# Patient Record
Sex: Male | Born: 1985 | Race: White | Hispanic: No | Marital: Single | Smoking: Current some day smoker
Health system: Southern US, Community
[De-identification: ages and names within clinical notes are randomized; demographics above are authoritative.]

## PROBLEM LIST (undated history)

## (undated) DIAGNOSIS — C021 Malignant neoplasm of border of tongue: Secondary | ICD-10-CM

## (undated) DIAGNOSIS — M5126 Other intervertebral disc displacement, lumbar region: Secondary | ICD-10-CM

## (undated) DIAGNOSIS — Z923 Personal history of irradiation: Secondary | ICD-10-CM

## (undated) DIAGNOSIS — A770 Spotted fever due to Rickettsia rickettsii: Secondary | ICD-10-CM

## (undated) HISTORY — PX: BACK SURGERY: SHX140

## (undated) HISTORY — PX: ADENOIDECTOMY: SUR15

## (undated) HISTORY — PX: SUPERFICIAL LYMPH NODE BIOPSY / EXCISION: SUR127

## (undated) HISTORY — DX: Malignant neoplasm of border of tongue: C02.1

---
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
## 2006-10-01 ENCOUNTER — Inpatient Hospital Stay (HOSPITAL_COMMUNITY): Admission: AC | Admit: 2006-10-01 | Discharge: 2006-10-02 | Payer: Self-pay

## 2006-10-11 ENCOUNTER — Emergency Department (HOSPITAL_COMMUNITY): Admission: EM | Admit: 2006-10-11 | Discharge: 2006-10-11 | Payer: Self-pay | Admitting: Emergency Medicine

## 2007-07-21 ENCOUNTER — Emergency Department (HOSPITAL_COMMUNITY): Admission: EM | Admit: 2007-07-21 | Discharge: 2007-07-21 | Payer: Self-pay | Admitting: Emergency Medicine

## 2007-11-25 ENCOUNTER — Emergency Department (HOSPITAL_COMMUNITY): Admission: EM | Admit: 2007-11-25 | Discharge: 2007-11-25 | Payer: Self-pay | Admitting: Emergency Medicine

## 2009-03-22 ENCOUNTER — Emergency Department (HOSPITAL_COMMUNITY): Admission: EM | Admit: 2009-03-22 | Discharge: 2009-03-22 | Payer: Self-pay | Admitting: Emergency Medicine

## 2011-02-14 LAB — CBC
HCT: 40.9 % (ref 39.0–52.0)
MCHC: 35.6 g/dL (ref 30.0–36.0)
RBC: 4.5 MIL/uL (ref 4.22–5.81)

## 2011-02-14 LAB — DIFFERENTIAL
Basophils Absolute: 0 10*3/uL (ref 0.0–0.1)
Eosinophils Absolute: 0 10*3/uL (ref 0.0–0.7)
Eosinophils Relative: 0 % (ref 0–5)
Monocytes Relative: 9 % (ref 3–12)
Neutro Abs: 7.2 10*3/uL (ref 1.7–7.7)

## 2011-02-14 LAB — COMPREHENSIVE METABOLIC PANEL
AST: 27 U/L (ref 0–37)
Albumin: 4.2 g/dL (ref 3.5–5.2)
CO2: 26 mEq/L (ref 19–32)
Calcium: 8.9 mg/dL (ref 8.4–10.5)
Chloride: 105 mEq/L (ref 96–112)
GFR calc Af Amer: >60 mL/min (ref >60)
GFR calc non Af Amer: >60 mL/min (ref >60)
Potassium: 3.5 mEq/L (ref 3.5–5.1)
Total Bilirubin: 0.4 mg/dL (ref 0.3–1.2)

## 2011-02-14 LAB — URINALYSIS, ROUTINE W REFLEX MICROSCOPIC
Protein, ur: NEGATIVE mg/dL
Urobilinogen, UA: 1 mg/dL (ref 0.0–1.0)

## 2011-03-24 NOTE — Op Note (Signed)
[place]. [facility]  Patient:    JAIMERE, FEUTZ                     MRN: [id number] Adm. Date:  [id number] Disc. Date: [id number] Attending:  Shelba Flake CC:         Marinus Maw, M.D.   Operative Report  CCS [phone].  PREOPERATIVE DIAGNOSIS:  Left groin adenopathy.  POSTOPERATIVE DIAGNOSIS:  Left groin adenopathy.  PROCEDURE:  Left inguinal node biopsy.  SURGEON:  Thornton Park. Daphine Deutscher, M.D.  ANESTHESIA:  LMA, general.  DESCRIPTION OF PROCEDURE:  Panagiotis Oelkers was taken to OR 3 and given general by LMA.  The left groin node was previously marked with his assistance, and with a little oblique incision was made, carried down through the fatty tissue.  I incised the anterior fascia and dissected around this node, which was hard, and I mobilized it.  I ligated its blood supply and then where it was matted to another adjoining node, I oversewed that with a running horizontal mattress suture of 4-0 Vicryl and then amputated it.  I sent this node, which was approximately 2 cm in greatest dimension, for touch preps and permanent sections.  No bleeding or lymphatic drainage was noted. It was then closed with 4-0 Vicryl subcutaneously and subcuticularly.  The patient seemed to tolerate the procedure well and was taken to the recovery room in satisfactory condition. DD:  09/25/00 TD:  09/25/00 Job: [id number]

## 2011-04-15 ENCOUNTER — Inpatient Hospital Stay (INDEPENDENT_AMBULATORY_CARE_PROVIDER_SITE_OTHER)
Admission: RE | Admit: 2011-04-15 | Discharge: 2011-04-15 | Disposition: A | Discharge status: Home / Self Care | Payer: Self-pay | Source: Ambulatory Visit | Attending: Emergency Medicine | Admitting: Emergency Medicine

## 2011-04-15 DIAGNOSIS — J029 Acute pharyngitis, unspecified: Secondary | ICD-10-CM

## 2011-04-15 DIAGNOSIS — J020 Streptococcal pharyngitis: Secondary | ICD-10-CM

## 2011-04-15 LAB — POCT RAPID STREP A: Streptococcus, Group A Screen (Direct): POSITIVE — AB

## 2012-05-30 ENCOUNTER — Emergency Department (HOSPITAL_COMMUNITY)
Admission: EM | Admit: 2012-05-30 | Discharge: 2012-05-30 | Disposition: A | Discharge status: Home / Self Care | Payer: Self-pay | Attending: Emergency Medicine | Admitting: Emergency Medicine

## 2012-05-30 ENCOUNTER — Encounter (HOSPITAL_COMMUNITY): Payer: Self-pay | Admitting: *Deleted

## 2012-05-30 DIAGNOSIS — T148 Other injury of unspecified body region: Secondary | ICD-10-CM | POA: Insufficient documentation

## 2012-05-30 DIAGNOSIS — B379 Candidiasis, unspecified: Secondary | ICD-10-CM | POA: Insufficient documentation

## 2012-05-30 DIAGNOSIS — D1739 Benign lipomatous neoplasm of skin and subcutaneous tissue of other sites: Secondary | ICD-10-CM | POA: Insufficient documentation

## 2012-05-30 DIAGNOSIS — W57XXXA Bitten or stung by nonvenomous insect and other nonvenomous arthropods, initial encounter: Secondary | ICD-10-CM | POA: Insufficient documentation

## 2012-05-30 DIAGNOSIS — F17200 Nicotine dependence, unspecified, uncomplicated: Secondary | ICD-10-CM | POA: Insufficient documentation

## 2012-05-30 DIAGNOSIS — D170 Benign lipomatous neoplasm of skin and subcutaneous tissue of head, face and neck: Secondary | ICD-10-CM

## 2012-05-30 HISTORY — DX: Spotted fever due to Rickettsia rickettsii: A77.0

## 2012-05-30 MED ORDER — KETOCONAZOLE 2 % EX SHAM: MEDICATED_SHAMPOO | CUTANEOUS | Status: AC

## 2012-05-30 NOTE — ED Notes (Signed)
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

## 2012-05-30 NOTE — ED Provider Notes (Signed)
History     CSN: [id number]  Arrival date & time 05/30/12  4098   First MD Initiated Contact with Patient 05/30/12 2125      Chief Complaint  Patient presents with  . Rash    (Consider location/radiation/quality/duration/timing/severity/associated sxs/prior treatment) HPI Comments: Larry Glover is a 26 y.o. Male who presents to the emergency department c/o rash and bumps.  He states the rash has been present for > 52yrs.  It itches severely and has spread from his groin onto his abdomen and thighs.  He has tried Benadryl, talcum powder, athlete's foot spray, hydrocortisone and bleach baths.  He states the hydrocortisone seems to make it worse, but nothing makes it better. He denies fever, chills, appetite changes, rash on other parts of the body, oozing of the rash.  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  He states a Hx of "severe jock itch" in his teens. He also c/o bumps found on her chest, arms, legs.  He states they are new since this morning and very itchy.  They have not been oozing either and he has not tried anything for them. He was outside last night in a friend's garden, but does not remember being bitten by anything.  Lastly he c/o a large bump on his head present for many years as well.  It is not tender, it is not erythematous, it is not itchy.  It has grown larger slowly.  He does not have headaches, pain in the area, or other constitutional symptoms.     The patient has medical history significant for:   Past Medical History:   [facility] spotted fever                                The onset of the symptoms was  gradual starting 2 year ago,  The Course is  persistent, gradually worsened Hydrocortisone, hot showers makes symptoms worse, nothing makes symptoms better Has associated itch, burning Denies fever,  oozing      Patient is a 26 y.o. male presenting with rash. The history is provided by the patient, medical records and the spouse. History Limited By: none.  Rash  This is a chronic problem. The current episode started more than 1 week ago. The problem has been gradually worsening.    Past Medical History  Diagnosis Date  . Rocky Mountain spotted fever     Past Surgical History  Procedure Date  . Back surgery     No family history on file.  History  Substance Use Topics  . Smoking status: Current Everyday Smoker -- 1.0 packs/day  . Smokeless tobacco: Not on file  . Alcohol Use: Yes     social      Review of Systems  Constitutional: Negative for fever, diaphoresis, appetite change, fatigue and unexpected weight change.  HENT: Negative for mouth sores.   Eyes: Negative for visual disturbance.  Respiratory: Negative for cough, chest tightness, shortness of breath and wheezing.   Cardiovascular: Negative for chest pain.  Gastrointestinal: Negative for nausea, vomiting, abdominal pain, diarrhea and constipation.  Genitourinary: Negative for dysuria, urgency, frequency and hematuria.  Musculoskeletal: Negative for back pain.  Skin: Positive for rash.       "bump" on top of head  Neurological: Negative for syncope, light-headedness and headaches.  Hematological: Does not bruise/bleed easily.  Psychiatric/Behavioral: Negative for disturbed wake/sleep cycle. The patient is not nervous/anxious.     Allergies  Review of patient's allergies indicates no known allergies.  Home Medications   Current Outpatient Rx  Name Route Sig Dispense Refill  . DIPHENHYDRAMINE HCL 25 MG PO CAPS Oral Take 25 mg by mouth once.      BP 110/84  Pulse 84  Temp 98.5 F (36.9 C) (Oral)  Resp 20  SpO2 98%  Physical Exam  Nursing note and vitals reviewed. Constitutional: He appears well-developed and well-nourished. No distress.  HENT:  Head: Normocephalic and atraumatic.        Lump on scalp in the temporal region, discrete, non-tender, mobile mass.  No erythema, necrosis, discoloration.    Eyes: Conjunctivae are normal. No scleral icterus.  Neck: Normal range of motion.  Cardiovascular: Normal rate, regular rhythm, normal heart sounds and intact distal pulses.   Pulmonary/Chest: Effort normal and breath sounds normal. No respiratory distress. He has no wheezes.  Musculoskeletal: Normal range of motion. He exhibits no edema.  Lymphadenopathy:    He has no cervical adenopathy.  Neurological: He is alert. No cranial nerve deficit. He exhibits normal muscle tone. Coordination normal.       Speech is clear and goal oriented Moves extremities without ataxia  Skin: Skin is warm and dry. Rash noted. He is not diaphoretic.       Large rash located in the groin area, inguinal folds and extending up onto the abdomen and down onto the inner thighs.  The rash is comprised of large patches of erythematous and scaling skin with some central clearing and discrete erythematous borders with large satellite lesions.  It is present around the scrotum and in the perianal area as well.  It is not on the penis.    There is one patch of 4 erythematous papules on the abdomen with accompanying excoriations.  There are similar sites on the R upper arm and both legs.    Psychiatric: He has a normal mood and affect. His behavior is normal. Judgment and thought content normal.    ED Course  Procedures (including critical care time)  Labs Reviewed - No data to display No results found.   1. Candida albicans infection   2. Insect bite   3. Lipoma of scalp      MDM  Larry Glover is a healthy male who presents to the ER for rash.  He is nontoxic and nonseptic appearing.  The rash looks like a fungal vs candida infection of the groin and is supported by the worsening with use of Hydrocortisone and chronic athletes foot.  His papules appear to be insect bites that have been scratched  open.  The lump on his head appears to be a lipoma and raises no red flags for melanoma or other skin cancer.  I will discharge home with an antifungal treatment of Nizoral shampoo and refer to dermatology.  I have recommended that he follow up with a PCP if he is unable to see dermatology.    1. Medications: Nizoral shampoo 2. Treatment: keep areas of rash and bites clean and dry.  OTC hydrocortisone or benadryl may be used on the bites but not on the rash.   3. Follow Up: with dermatology as soon as possible          Dierdre Forth, PA-C 05/30/12 2336

## 2012-05-30 NOTE — ED Notes (Signed)
Knot on top of head x 2 years

## 2012-05-31 NOTE — ED Provider Notes (Signed)
Medical screening examination/treatment/procedure(s) were performed by non-physician practitioner and as supervising physician I was immediately available for consultation/collaboration.  Seif Teichert, MD 05/31/12 2337 

## 2014-02-23 ENCOUNTER — Emergency Department (HOSPITAL_COMMUNITY)
Admission: EM | Admit: 2014-02-23 | Discharge: 2014-02-23 | Disposition: A | Discharge status: Home / Self Care | Payer: Self-pay | Attending: Emergency Medicine | Admitting: Emergency Medicine

## 2014-02-23 ENCOUNTER — Encounter (HOSPITAL_COMMUNITY): Payer: Self-pay | Admitting: Emergency Medicine

## 2014-02-23 DIAGNOSIS — Z8619 Personal history of other infectious and parasitic diseases: Secondary | ICD-10-CM | POA: Insufficient documentation

## 2014-02-23 DIAGNOSIS — K0889 Other specified disorders of teeth and supporting structures: Secondary | ICD-10-CM

## 2014-02-23 DIAGNOSIS — K089 Disorder of teeth and supporting structures, unspecified: Secondary | ICD-10-CM | POA: Insufficient documentation

## 2014-02-23 DIAGNOSIS — K029 Dental caries, unspecified: Secondary | ICD-10-CM | POA: Insufficient documentation

## 2014-02-23 DIAGNOSIS — F17200 Nicotine dependence, unspecified, uncomplicated: Secondary | ICD-10-CM | POA: Insufficient documentation

## 2014-02-23 MED ORDER — NAPROXEN 375 MG PO TABS: 375.0000 mg | ORAL_TABLET | Freq: Two times a day (BID) | ORAL | Status: DC

## 2014-02-23 MED ORDER — PENICILLIN V POTASSIUM 250 MG PO TABS: 500.0000 mg | ORAL_TABLET | Freq: Four times a day (QID) | ORAL | Status: AC

## 2014-02-23 MED ORDER — OXYCODONE-ACETAMINOPHEN 5-325 MG PO TABS: 1.0000 | ORAL_TABLET | Freq: Three times a day (TID) | ORAL | Status: DC | PRN

## 2014-02-23 NOTE — Discharge Instructions (Signed)
Please call your doctor for a followup appointment within 24-48 hours. When you talk to your doctor please let them know that you were seen in the emergency department and have them acquire all of your records so that they can discuss the findings with you and formulate a treatment plan to fully care for your new and ongoing problems. Please call and set-up an appointment with dentist - will need tooth to be extracted Please rest and stay hydrated Please take antibiotics as prescribed Please take pain medications as prescribed - while on pain medications there is to be no drinking alcohol, driving, operating any heavy machinery - if extra please dispose in a proper manner. Please do not take any extra tylenol for this can lead to tylenol overdose and liver failure.  Please continue to monitor symptoms closely and if symptoms are to worsen or change (fever greater than 101, chills, chest pain, shortness of breath, difficulty breathing, neck pain, difficulty swallowing, swelling to the face, hot to the face, drainage, pus drainage, bleeding, worsening pain headache, dizziness, visual changes) please report back to the ED immediately   Dental Pain A tooth ache may be caused by cavities (tooth decay). Cavities expose the nerve of the tooth to air and hot or cold temperatures. It may come from an infection or abscess (also called a boil or furuncle) around your tooth. It is also often caused by dental caries (tooth decay). This causes the pain you are having. DIAGNOSIS  Your caregiver can diagnose this problem by exam. TREATMENT   If caused by an infection, it may be treated with medications which kill germs (antibiotics) and pain medications as prescribed by your caregiver. Take medications as directed.  Only take over-the-counter or prescription medicines for pain, discomfort, or fever as directed by your caregiver.  Whether the tooth ache today is caused by infection or dental disease, you should see  your dentist as soon as possible for further care. SEEK MEDICAL CARE IF: The exam and treatment you received today has been provided on an emergency basis only. This is not a substitute for complete medical or dental care. If your problem worsens or new problems (symptoms) appear, and you are unable to meet with your dentist, call or return to this location. SEEK IMMEDIATE MEDICAL CARE IF:   You have a fever.  You develop redness and swelling of your face, jaw, or neck.  You are unable to open your mouth.  You have severe pain uncontrolled by pain medicine. MAKE SURE YOU:   Understand these instructions.  Will watch your condition.  Will get help right away if you are not doing well or get worse. Document Released: 10/23/2005 Document Revised: 01/15/2012 Document Reviewed: 06/10/2008 [facility] Patient Information 2014 [place].    Emergency Department Resource Guide 1) Find a Doctor and Pay Out of Pocket Although you won't have to find out who is covered by your insurance plan, it is a good idea to ask around and get recommendations. You will then need to call the office and see if the doctor you have chosen will accept you as a new patient and what types of options they offer for patients who are self-pay. Some doctors offer discounts or will set up payment plans for their patients who do not have insurance, but you will need to ask so you aren't surprised when you get to your appointment.  2) Contact Your Local Health Department Not all health departments have doctors that can see patients for  sick visits, but many do, so it is worth a call to see if yours does. If you don't know where your local health department is, you can check in your phone book. The CDC also has a tool to help you locate your state's health department, and many state websites also have listings of all of their local health departments.  3) Find a [facility] If your illness is not likely to be very  severe or complicated, you may want to try a walk in clinic. These are popping up all over the country in pharmacies, drugstores, and shopping centers. They're usually staffed by nurse practitioners or physician assistants that have been trained to treat common illnesses and complaints. They're usually fairly quick and inexpensive. However, if you have serious medical issues or chronic medical problems, these are probably not your best option.  No Primary Care Doctor: - Call Health Connect at  [phone] - they can help you locate a primary care doctor that  accepts your insurance, provides certain services, etc. - Physician Referral Service- [phone]  Chronic Pain Problems: Organization         Address  Phone   Notes  [facility]  [phone] Patients need to be referred by their primary care doctor.   Medication Assistance: Organization         Address  Phone   Notes  [facility] Medication [facility] [address] [place], [place] [address] [phone] --Must be a resident of [facility] -- Must have NO insurance coverage whatsoever (no Medicaid/ Medicare, etc.) -- The pt. MUST have a primary care doctor that directs their care regularly and follows them in the community   [facility]  [phone]   [employer]  [phone]    Agencies that provide inexpensive medical care: Organization         Address  Phone   Notes  [place]  [phone]   [facility]    [phone]   [facility] [place], [place] [address] [phone]   [place] [address], [place] [phone]   Planned Parenthood    [phone]   [facility]    [phone]   [place] and [place] [address], [place] Phone:  [phone], Fax:  [phone] Hours of Operation:  9 am - 6 pm, M-F.  Also accepts  Medicaid/Medicare and self-pay.  [facility] for [place], [address], [place] Phone: [phone], Fax: [phone]. Hours of Operation:  8:30 am - 5:30 pm, M-F.  Also accepts Medicaid and self-pay.  [facility] [place] [address] [place] Phone: [phone]   [place], [place], [place] [phone], Ext. 123 Mondays & Thursdays: 7-9 AM.  First 15 patients are seen on a first come, first serve basis.    [place] Providers:  Organization         Address  Phone   Notes  [facility] [address], [place] [phone] Also accepts self-pay patients.  [place], [place]  [phone]   [place], [address], [place] [phone]   [facility] [address] [place] [phone]  Lucianne Lei [address], [place]   [phone] Only accepts [place] patients after they have their name applied to their card.   Self-Pay (no insurance) in [facility]:  Organization         Address  Phone   Notes  Sickle Cell Patients, [facility] [place] [phone]   [facility] [place] [phone]   [facility] [place]  [place], [place], [place] [phone]   [facility]/Dr. Osei-Bonsu  [address], [place] or [address], [place] [phone] Phone number for both [place] and [place] locations is the same.  Urgent Medical and [facility] [address] [place] [phone]   [facility] [address], [place] or [address] [phone]   [facility] [address], [place] [phone], phone; [phone], fax Sees patients 1st and 3rd Saturday of every month.  Must not qualify for public or private insurance (i.e. Medicaid, Medicare, [place] Health Choice, Veterans' Benefits)  Household income should be no more than 200% of the poverty level The clinic cannot treat you if you are pregnant or think you are pregnant  Sexually transmitted diseases are not treated at the clinic.    Dental Care: Organization         Address  Phone  Notes  [facility] Department of [facility] [place] [phone] Accepts children up to age 11 who are enrolled in [place] or [place]; pregnant women with a Medicaid card; and children who have applied for Medicaid or [place] Health Choice, but were declined, whose parents can pay a reduced fee at time of service.  [facility] Department of [facility]  [address], [place] [phone] Accepts children up to age 29 who are enrolled in [place] or [place]; pregnant women with a Medicaid card; and children who have applied for Medicaid or [place] Health Choice, but were declined, whose parents can pay a reduced fee at time of service.  [facility] PRO[facility]  [place] [phone] Patients are seen by appointment only. Walk-ins are not accepted. [place] will see patients 20 years of age and older. Monday - Tuesday (8am-5pm) Most Wednesdays (8:30-5pm) $30 per visit, cash only  [facility] Adult Dental Access PROGRAM  [address], [facility] [phone] Patients are seen by appointment only. Walk-ins are not accepted. [place] will see patients 82 years of age and older. One Wednesday Evening (Monthly: Volunteer Based).  $30 per visit, cash only  [place]  [phone] for adults; Children under age 23, call Graduate Pediatric Dentistry at [phone]. Children aged  26-14, please call [phone] to request a pediatric application.  Dental services are provided in all areas of dental care including fillings, crowns and bridges, complete and partial dentures, implants, gum treatment, root canals, and extractions. Preventive care is also provided. Treatment is provided to both adults and children. Patients are selected via a lottery and there is often a waiting list.   [facility] [address], [place]  [phone] [email or web address], [place], [place] [phone], Ext.  123 Second and Fourth Thursday of each month, opens at 6:30 AM; Clinic ends at 9 AM.  Patients are seen on a first-come first-served basis, and a limited number are seen during each clinic.   [facility]  [address] Hillard Danker [place], [place] [phone]   Eligibility Requirements You must have lived in [place], [place], or [place] counties for at least the last three months.   You cannot be eligible for state or federal sponsored [employer], including [employer], [place], or [employer].   You generally cannot be eligible for healthcare insurance through your employer.    How to apply: Eligibility screenings are held every Tuesday and Wednesday afternoon from 1:00 pm until 4:00 pm. You do not need an appointment for the interview!  [facility] [address], [place], [place]   [place]  [place] Department  [place]  [phone]    Behavioral Health Resources in the Community: Intensive Outpatient Programs Organization         Address  Phone  Notes  [place]. [address] [place], [place] [phone]   [facility] Outpatient [address] [place] [address], [place], [place]   ADS: [facility] Drug [facility] [address],  [place], [place]   [place] [address]  [place], [place] or [phone]   Substance Abuse Resources Organization         Address  Phone  Notes  Alcohol and Drug Services  [phone]   [place]  [phone]   [facility] [place]   Chinita Pester  [phone]   Residential & Outpatient Substance Abuse Program  [phone]   Psychological Services Organization         Address  Phone  Notes  [facility] [place]  [place]  [phone]   [place] [address] [place] or [phone]    Mobile Crisis Teams Organization         Address  Phone  Notes  [facility], Mobile Crisis Care Unit  [phone]   [facility]  [address] [place], [place]   [place] [address] [place] [phone]    Self-Help/Support Groups Organization         Address  Phone             Notes  [place]. of [place] - variety of support groups  [place] Call for more information  Narcotics Anonymous (NA), [facility] [address], [employer] [place]  2 meetings at this location   Special educational needs teacher         Address  Phone  Notes  [facility] [place],    [place]  [phone]   [facility]  [address] [place] [address]24235, [place], [place]   [place], [place] [phone] Admissions: 8am-3pm M-F  Incentives Substance [place] [address]    [place], [place] [phone]   [facility] [address] Jadene Pierini [place], [place]   [facility] [place].,  [place], [place] - Intensive Outpatient [address], [place], [place]   [facility]  ([place]  [facility]oc[facility] [address]  °[place], [place] [phone] or [phone]   °[facility] ([facility]) [address] [place], [place] [phone] Accepts Medicaid  °[facility] [address]  °[place] 1-800-659-3381 Substance Abuse/Addiction Treatment  ° °[place] Behavioral Health Resources °Organization         Address  Phone  Notes  °[facility]  ([phone]) [phone]   °Julie Brannon, PhD [address] [place], [place]   ([phone]) [phone] or (336) [phone]   °[facility]   [address] °[place], [place] (336) 349-4454   °[facility] [address], [place], [place] [phone]) [phone] Insurance/Medicaid/sponsorship through [facility]  °[facility] [address]                                    [place], [place] [phone]) [phone] Therapy/tele-psych/case  °[facility] [address]  ° [place], [place] ([phone]    °Dr. Arfeen  ([phone]) [phone]   °[facility] [place]  [employer] [place] [facility] 1) [address], [place] °2) [address], [place] °3)  [address], [place] ([phone]) [phone] °(336) [phone] ° °(336) [phone]   °[place] Child Abuse Hotline (336) 342-1394 or (336) [phone] (After Hours)    ° ° ° °

## 2014-02-23 NOTE — ED Provider Notes (Signed)
CSN: [id number]     Arrival date & time 02/23/14  1500 History  This chart was scribed for non-physician practitioner, Jamse Mead, PA-C,working with Saddie Benders. Dorna Mai, MD, by Marlowe Kays, ED Scribe.  This patient was seen in room WTR7/WTR7 and the patient's care was started at 5:45 PM.  Chief Complaint  Patient presents with  . Dental Pain   The history is provided by the patient. No language interpreter was used.   HPI Comments:  Larry Glover is a 28 y.o. male who presents to the Emergency Department complaining of severe upper right dental pain secondary to having two broken teeth. He states the pain started approximately two to three days ago and is a stabbing pain. He states he is experiencing some mild facial swelling as well. Pt states hot or cold makes the pain worse and he has been avoiding chewing on the right side. Pt reports taking Ibuprofen with no relief. He denies fever, chills, drooling, CP, SOB, drainage or bleeding from the site, no blurry vision or loss of vision. He denies any allergies to any medications.  Past Medical History  Diagnosis Date  . Rocky [place] spotted fever    Past Surgical History  Procedure Laterality Date  . Back surgery     History reviewed. No pertinent family history. History  Substance Use Topics  . Smoking status: Current Every Day Smoker -- 1.00 packs/day  . Smokeless tobacco: Not on file  . Alcohol Use: Yes     Comment: social    Review of Systems  Constitutional: Negative for fever and chills.  HENT: Positive for dental problem and facial swelling. Negative for drooling.   Eyes: Negative for visual disturbance.  Respiratory: Negative for shortness of breath.   Cardiovascular: Negative for chest pain.  All other systems reviewed and are negative.   Allergies  Review of patient's allergies indicates no known allergies.  Home Medications   Prior to Admission medications   Medication Sig Start Date End Date Taking?  Authorizing Provider  diphenhydrAMINE (BENADRYL) 25 mg capsule Take 25 mg by mouth once.    Historical Provider, MD   Triage Vitals: BP 111/57  Pulse 61  Temp(Src) 98.3 F (36.8 C) (Oral)  Resp 18  Ht 5\' 11"  (1.803 m)  Wt 195 lb (88.451 kg)  BMI 27.21 kg/m2  SpO2 98% Physical Exam  Nursing note and vitals reviewed. Constitutional: He is oriented to person, place, and time. He appears well-developed and well-nourished.  HENT:  Head: Normocephalic and atraumatic.  Mouth/Throat: Oropharynx is clear and moist and mucous membranes are normal. He does not have dentures. No trismus in the jaw. Dental caries present. No dental abscesses, uvula swelling or lacerations. No oropharyngeal exudate.    Negative facial swelling noted. First molar of right maxillary jawline identified to be diagrammed in decaying. Negative abscess formation noted. Negative inflammation, swelling, erythema, bleeding or drainage noted to the gumline. Uvula midline with symmetrical elevation. Negative deviation of the uvula. Negative uvula swelling. Negative trismus. Negative sublingual lesions. Negative trismus.  Eyes: Conjunctivae and EOM are normal. Pupils are equal, round, and reactive to light. Right eye exhibits no discharge. Left eye exhibits no discharge.  Neck: Normal range of motion. Neck supple. No tracheal deviation present.  Negative neck stiffness Negative nuchal rigidity Negative cervical LAD Negative meningeal signs   Cardiovascular: Normal rate, regular rhythm and normal heart sounds.  Exam reveals no friction rub.   No murmur heard. Pulses:      Radial  pulses are 2+ on the right side, and 2+ on the left side.  Pulmonary/Chest: Effort normal and breath sounds normal. No respiratory distress. He has no wheezes. He has no rales.  Musculoskeletal: Normal range of motion.  Lymphadenopathy:    He has no cervical adenopathy.  Neurological: He is alert and oriented to person, place, and time. No cranial  nerve deficit. He exhibits normal muscle tone. Coordination normal.  Skin: Skin is warm and dry.  Psychiatric: He has a normal mood and affect. His behavior is normal.    ED Course  Procedures (including critical care time) DIAGNOSTIC STUDIES: Oxygen Saturation is 98% on RA, normal by my interpretation.   COORDINATION OF CARE: 5:50 PM- Will give pt resource guide for PCP and dental referral. Will prescribe PCN and pain medication. Pt verbalizes understanding and agrees to plan.  Medications - No data to display  Labs Review Labs Reviewed - No data to display  Imaging Review No results found.   EKG Interpretation None      MDM   Final diagnoses:  Pain, dental    Filed Vitals:   02/23/14 1535 02/23/14 1817  BP: 111/57 120/71  Pulse: 61 79  Temp: 98.3 F (36.8 C)   TempSrc: Oral   Resp: 18 14  Height: 5\' 11"  (1.803 m)   Weight: 195 lb (88.451 kg)   SpO2: 98% 99%    I personally performed the services described in this documentation, which was scribed in my presence. The recorded information has been reviewed and is accurate.  Patient presenting to the ED with dental pain does been ongoing for the past 2-3 days localize the right maxillary jawline described as a stabbing pain worse with chewing. Reported that he's been using over-the-counter medications with minimal relief. Stated that he hasn't seen a dentist in a while. Alert and oriented. GCS 15. Heart rate and rhythm normal. Lungs clear to auscultation. Negative stridor, negative respiratory distress. Negative use of excess her muscles. Patient is able to speak in full senses without difficulty. Poor dentition identified-right first molar of the maxillary jawline identified to be diagrammed in decaying process. Negative trismus. Negative findings of erythema to the gumline, negative active drainage or bleeding. Negative findings of periapical abscess. Negative findings of erythema, inflammation, lesions, sores noted to  the gumline. Negative uvula swelling, negative deviation. Negative sublingual lesions. Doubt Ludwig's angina. Doubt peritonsillar or retropharyngeal abscess. Poor dentition leading to dental pain. Patient stable, afebrile. Discharged patient. Referred patient to dentist. Dental referral sheet given. Discussed with patient to rest and stay hydrated. Discharge patient with small dose of pain medications and antibiotics - discussed course, cautions, disposal technique. Discussed with patient to closely monitor symptoms and if symptoms are to worsen or change to report back to the ED - strict return instructions given.  Patient agreed to plan of care, understood, all questions answered.   Jamse Mead, PA-C 02/23/14 2216

## 2014-02-23 NOTE — ED Notes (Signed)
Pt moved to TR 7 at this time.

## 2014-02-23 NOTE — ED Notes (Signed)
Initial contact - pt reports dental pain x2 days, denies fevers/chills or other complaints.  Skin PWD.  Speaking full/clear sentences.  NAD.

## 2014-02-23 NOTE — ED Notes (Signed)
Pt having dental pain for 2 days.

## 2014-02-23 NOTE — ED Notes (Signed)
Pt not answering initial call to come to treatment area, will attempt again.

## 2014-02-26 NOTE — ED Provider Notes (Signed)
Medical screening examination/treatment/procedure(s) were performed by non-physician practitioner and as supervising physician I was immediately available for consultation/collaboration.   EKG Interpretation None        Seneca Hoback. Dorna Mai, MD 02/26/14 [phone]

## 2014-05-12 ENCOUNTER — Emergency Department (HOSPITAL_COMMUNITY)
Admission: EM | Admit: 2014-05-12 | Discharge: 2014-05-12 | Disposition: A | Discharge status: Home / Self Care | Payer: Self-pay | Attending: Emergency Medicine | Admitting: Emergency Medicine

## 2014-05-12 ENCOUNTER — Emergency Department (HOSPITAL_COMMUNITY): Payer: Self-pay

## 2014-05-12 ENCOUNTER — Encounter (HOSPITAL_COMMUNITY): Payer: Self-pay | Admitting: Emergency Medicine

## 2014-05-12 DIAGNOSIS — S0510XA Contusion of eyeball and orbital tissues, unspecified eye, initial encounter: Secondary | ICD-10-CM | POA: Insufficient documentation

## 2014-05-12 DIAGNOSIS — H18822 Corneal disorder due to contact lens, left eye: Secondary | ICD-10-CM

## 2014-05-12 DIAGNOSIS — Z8619 Personal history of other infectious and parasitic diseases: Secondary | ICD-10-CM | POA: Insufficient documentation

## 2014-05-12 DIAGNOSIS — Y9389 Activity, other specified: Secondary | ICD-10-CM | POA: Insufficient documentation

## 2014-05-12 DIAGNOSIS — Z791 Long term (current) use of non-steroidal anti-inflammatories (NSAID): Secondary | ICD-10-CM | POA: Insufficient documentation

## 2014-05-12 DIAGNOSIS — F17200 Nicotine dependence, unspecified, uncomplicated: Secondary | ICD-10-CM | POA: Insufficient documentation

## 2014-05-12 DIAGNOSIS — H18829 Corneal disorder due to contact lens, unspecified eye: Secondary | ICD-10-CM | POA: Insufficient documentation

## 2014-05-12 DIAGNOSIS — X58XXXA Exposure to other specified factors, initial encounter: Secondary | ICD-10-CM | POA: Insufficient documentation

## 2014-05-12 DIAGNOSIS — H18892 Other specified disorders of cornea, left eye: Secondary | ICD-10-CM

## 2014-05-12 DIAGNOSIS — Y9269 Other specified industrial and construction area as the place of occurrence of the external cause: Secondary | ICD-10-CM | POA: Insufficient documentation

## 2014-05-12 MED ORDER — TETRACAINE HCL 0.5 % OP SOLN
1.0000 [drp] | Freq: Once | OPHTHALMIC | Status: AC
  Administered 2014-05-12: 1 [drp] via OPHTHALMIC
  Filled 2014-05-12: qty 2

## 2014-05-12 MED ORDER — POLYMYXIN B-TRIMETHOPRIM 10000-0.1 UNIT/ML-% OP SOLN: 1.0000 [drp] | OPHTHALMIC | Status: DC

## 2014-05-12 MED ORDER — FLUORESCEIN SODIUM 1 MG OP STRP
1.0000 | ORAL_STRIP | Freq: Once | OPHTHALMIC | Status: AC
  Administered 2014-05-12: 1 via OPHTHALMIC
  Filled 2014-05-12: qty 1

## 2014-05-12 NOTE — ED Provider Notes (Signed)
CSN: [id number]     Arrival date & time 05/12/14  1614 History  This chart was scribed for non-physician practitioner, Monico Blitz, PA-C working with Ezequiel Essex, MD by Frederich Balding, ED scribe. This patient was seen in room TR04C/TR04C and the patient's care was started at 5:42 PM.   Chief Complaint  Patient presents with  . Eye Pain   The history is provided by the patient. No language interpreter was used.   HPI Comments: Larry Glover is a 28 y.o. male who presents to the Emergency Department complaining of sudden onset left eye pain with associated redness, blurred vision and photophobia that started yesterday after drilling metal at work. Reports gradual onset swelling around his eye. Pt states that symptoms worsened today. States he tried some of his son's antibiotic eye drops with no relief. Pt's last tetanus was in 2010.  Past Medical History  Diagnosis Date  . Rocky Mountain spotted fever    Past Surgical History  Procedure Laterality Date  . Back surgery     History reviewed. No pertinent family history. History  Substance Use Topics  . Smoking status: Current Every Day Smoker -- 1.00 packs/day    Types: Cigarettes  . Smokeless tobacco: Not on file  . Alcohol Use: Yes     Comment: social    Review of Systems  HENT: Positive for facial swelling.   Eyes: Positive for photophobia, pain, redness and visual disturbance.  All other systems reviewed and are negative.  Allergies  Review of patient's allergies indicates no known allergies.  Home Medications   Prior to Admission medications   Medication Sig Start Date End Date Taking? Authorizing Provider  ibuprofen (ADVIL,MOTRIN) 200 MG tablet Take 400 mg by mouth every 6 (six) hours as needed for moderate pain.    Historical Provider, MD  naproxen (NAPROSYN) 375 MG tablet Take 1 tablet (375 mg total) by mouth 2 (two) times daily. 02/23/14   Marissa Sciacca, PA-C  oxyCODONE-acetaminophen (PERCOCET/ROXICET)  5-325 MG per tablet Take 1 tablet by mouth every 8 (eight) hours as needed for severe pain. 02/23/14   Marissa Sciacca, PA-C   BP 104/83  Pulse 70  Temp(Src) 98.8 F (37.1 C) (Oral)  Resp 18  SpO2 100%  Physical Exam  Nursing note and vitals reviewed. Constitutional: He is oriented to person, place, and time. He appears well-developed and well-nourished. No distress.  HENT:  Head: Normocephalic.  Eyes: EOM and lids are normal. Pupils are equal, round, and reactive to light. Lids are everted and swept, no foreign bodies found. Left eye exhibits no chemosis, no discharge, no exudate and no hordeolum. Left conjunctiva is injected.  Slit lamp exam:      The left eye shows no corneal abrasion, no corneal flare, no corneal ulcer and no fluorescein uptake.    Visual Acuity: Right Eye 20/20   Left Eye 20/50   Bilateral Eyes 20/20 Mild conjunctival injection on left side. Rust ring at 5 o'clock. Anterior chamber with no cells or flare. Positive photophobia. No consensual photophobia.   Cardiovascular: Normal rate.   Pulmonary/Chest: Effort normal. No stridor.  Musculoskeletal: Normal range of motion.  Neurological: He is alert and oriented to person, place, and time.  Psychiatric: He has a normal mood and affect.    ED Course  Procedures (including critical care time)  DIAGNOSTIC STUDIES: Oxygen Saturation is 100% on RA, normal by my interpretation.    COORDINATION OF CARE: 5:45 PM-Discussed treatment plan which includes checking for corneal  abrasion with pt at bedside and pt agreed to plan.   Labs Review Labs Reviewed - No data to display  Imaging Review Ct Orbitss W/o Cm  05/12/2014   CLINICAL DATA:  Eye pain and swelling.  EXAM: CT ORBITS WITHOUT CONTRAST  TECHNIQUE: Multidetector CT imaging of the orbits was performed following the standard protocol without intravenous contrast.  COMPARISON:  None.  FINDINGS: The visualized portion of the brain is unremarkable. The globes are  intact. Mild soft tissue thickening involving the upper lid region of the left eye. No intraorbital abnormality. No radiopaque foreign body. No periorbital cellulitis.  There is a mucous retention cyst or polyp in the right maxillary sinus. Otherwise, the paranasal sinuses and mastoid air cells are clear.  IMPRESSION: Mild soft tissue swelling involving the upper lip region of the left eye but no abscess, foreign body or periorbital cellulitis.   Electronically Signed   By: Kalman Jewels M.D.   On: 05/12/2014 18:51     EKG Interpretation None      MDM   Final diagnoses:  None    Filed Vitals:   05/12/14 1621 05/12/14 1922  BP: 104/83 121/67  Pulse: 70 70  Temp: 98.8 F (37.1 C)   TempSrc: Oral   Resp: 18 16  SpO2: 100% 99%    Medications  fluorescein ophthalmic strip 1 strip (1 strip Both Eyes Given 05/12/14 1804)  tetracaine (PONTOCAINE) 0.5 % ophthalmic solution 1 drop (1 drop Both Eyes Given 05/12/14 1759)    Larry Glover is a 28 y.o. male presenting with left eye pain after drilling a metal screw yesterday. Small rust ring with pinpoint central corneal abrasion. Patient is photophobic but there is no consensual photophobia. Anterior chamber is clear, pupils are equal round and reactive to light. Extraocular movement is intact. CT shows no foreign body. Patient will be given prescription for Polytrim and asked to follow closely with ophthalmology. Discussed case with attending MD who agrees with plan and stability to d/c to home.   Evaluation does not show pathology that would require ongoing emergent intervention or inpatient treatment. Pt is hemodynamically stable and mentating appropriately. Discussed findings and plan with patient/guardian, who agrees with care plan. All questions answered. Return precautions discussed and outpatient follow up given.   Discharge Medication List as of 05/12/2014  7:18 PM    START taking these medications   Details  !!  trimethoprim-polymyxin b (POLYTRIM) ophthalmic solution Place 1 drop into the left eye every 4 (four) hours., Starting 05/12/2014, Until Discontinued, Print     !! - Potential duplicate medications found. Please discuss with provider.         I personally performed the services described in this documentation, which was scribed in my presence. The recorded information has been reviewed and is accurate.  Monico Blitz, PA-C 05/12/14 2007

## 2014-05-12 NOTE — ED Notes (Signed)
Pt presents to department for evaluation of L eye pain. States he was drilling metal yesterday at work, after L eye became swollen and painful. Now states increased pain and discomfort today. 9/10 pain upon arrival. Pt is alert and oriented x4.

## 2014-05-12 NOTE — ED Notes (Addendum)
Right Eye 20/20   Left Eye 20/50   Both Eyes 20/20   Patient does not wear glasses or contacts.

## 2014-05-12 NOTE — Discharge Instructions (Signed)
Put one drop of the Polytrim in the left eye every 4 hours. Do not hesitate to return to the emergency room for any new, worsening or concerning symptoms.  Please follow with the ophthalmologist next 24-48 hours.  Do not hesitate to return to the Emergency Department for any new, worsening or concerning symptoms.   If you do not have a primary care doctor you can establish one at the   [facility]: [place] [id number] [phone]  After you establish care. Let them know you were seen in the emergency room. They must obtain records for further management.

## 2014-05-13 NOTE — ED Provider Notes (Signed)
Medical screening examination/treatment/procedure(s) were performed by non-physician practitioner and as supervising physician I was immediately available for consultation/collaboration.   EKG Interpretation None        Ezequiel Essex, MD 05/13/14 0025

## 2016-03-30 ENCOUNTER — Encounter (HOSPITAL_COMMUNITY): Payer: Self-pay | Admitting: Emergency Medicine

## 2016-03-30 ENCOUNTER — Emergency Department (HOSPITAL_COMMUNITY)
Admission: EM | Admit: 2016-03-30 | Discharge: 2016-03-30 | Disposition: A | Discharge status: Home / Self Care | Payer: Self-pay | Attending: Emergency Medicine | Admitting: Emergency Medicine

## 2016-03-30 ENCOUNTER — Emergency Department (HOSPITAL_COMMUNITY): Payer: Self-pay

## 2016-03-30 DIAGNOSIS — Z79899 Other long term (current) drug therapy: Secondary | ICD-10-CM | POA: Insufficient documentation

## 2016-03-30 DIAGNOSIS — R0789 Other chest pain: Secondary | ICD-10-CM | POA: Insufficient documentation

## 2016-03-30 DIAGNOSIS — F17210 Nicotine dependence, cigarettes, uncomplicated: Secondary | ICD-10-CM | POA: Insufficient documentation

## 2016-03-30 LAB — BASIC METABOLIC PANEL
ANION GAP: 6 (ref 5–15)
BUN: 16 mg/dL (ref 6–20)
CALCIUM: 9 mg/dL (ref 8.9–10.3)
CO2: 25 mmol/L (ref 22–32)
Chloride: 104 mmol/L (ref 101–111)
Creatinine, Ser: 1.43 mg/dL — ABNORMAL HIGH (ref 0.61–1.24)
Glucose, Bld: 106 mg/dL — ABNORMAL HIGH (ref 65–99)
Potassium: 4 mmol/L (ref 3.5–5.1)
SODIUM: 135 mmol/L (ref 135–145)

## 2016-03-30 LAB — I-STAT TROPONIN, ED: TROPONIN I, POC: 0 ng/mL (ref 0.00–0.08)

## 2016-03-30 LAB — CBC
HCT: 47.3 % (ref 39.0–52.0)
HEMOGLOBIN: 16.5 g/dL (ref 13.0–17.0)
MCH: 31.7 pg (ref 26.0–34.0)
MCHC: 34.9 g/dL (ref 30.0–36.0)
MCV: 90.8 fL (ref 78.0–100.0)
Platelets: 210 10*3/uL (ref 150–400)
RBC: 5.21 MIL/uL (ref 4.22–5.81)
RDW: 12.8 % (ref 11.5–15.5)
WBC: 7.9 10*3/uL (ref 4.0–10.5)

## 2016-03-30 MED ORDER — DIAZEPAM 5 MG PO TABS: 5.0000 mg | ORAL_TABLET | Freq: Two times a day (BID) | ORAL | Status: DC

## 2016-03-30 MED ORDER — IBUPROFEN 800 MG PO TABS: 800.0000 mg | ORAL_TABLET | Freq: Three times a day (TID) | ORAL | Status: DC

## 2016-03-30 MED ORDER — DIAZEPAM 5 MG PO TABS
5.0000 mg | ORAL_TABLET | Freq: Once | ORAL | Status: AC
  Administered 2016-03-30: 5 mg via ORAL
  Filled 2016-03-30: qty 1

## 2016-03-30 MED ORDER — KETOROLAC TROMETHAMINE 30 MG/ML IJ SOLN
30.0000 mg | Freq: Once | INTRAMUSCULAR | Status: AC
  Administered 2016-03-30: 30 mg via INTRAMUSCULAR
  Filled 2016-03-30: qty 1

## 2016-03-30 NOTE — ED Provider Notes (Signed)
CSN: [id number]     Arrival date & time 03/30/16  0913 History   First MD Initiated Contact with Patient 03/30/16 1007     Chief Complaint  Patient presents with  . Chest Pain    HPI   Larry Glover is an 30 y.o. male with history of RMSF who presents to the ED for evaluation of left sided chest pain. He reports one particular area inferior to his left breast that has been painful for over a week and progressively getting worse. He states the pain is worse with movement or when laying down. He states he works in [employer] but does not recall a specific injury or inciting event. States the pain is worse with deep breaths so he sometimes feels SOB due to the pain. Denies feeling faint or lightheaded. Denies diaphoressis. Denies radiation of the pain. He has not tried anything to alleviate the pain. Endorses smoking 1 PPD. Denies significant fam hx of cardiac events. Denies cough, congestion, fever, chills.    Past Medical History  Diagnosis Date  . Rocky Mountain spotted fever    Past Surgical History  Procedure Laterality Date  . Back surgery     No family history on file. Social History  Substance Use Topics  . Smoking status: Current Every Day Smoker -- 1.00 packs/day    Types: Cigarettes  . Smokeless tobacco: None  . Alcohol Use: Yes     Comment: social    Review of Systems  All other systems reviewed and are negative.     Allergies  Review of patient's allergies indicates no known allergies.  Home Medications   Prior to Admission medications   Medication Sig Start Date End Date Taking? Authorizing Provider  diazepam (VALIUM) 5 MG tablet Take 1 tablet (5 mg total) by mouth 2 (two) times daily. 03/30/16   Olivia Canter Razia Screws, PA-C  ibuprofen (ADVIL,MOTRIN) 800 MG tablet Take 1 tablet (800 mg total) by mouth 3 (three) times daily. 03/30/16   Olivia Canter Maelynn Moroney, PA-C  trimethoprim-polymyxin b (POLYTRIM) ophthalmic solution Place 1 drop into the left eye every 4 (four)  hours. Patient not taking: Reported on 03/30/2016 05/12/14   Elmyra Ricks Pisciotta, PA-C   BP 121/79 mmHg  Pulse 73  Temp(Src) 97.8 F (36.6 C) (Oral)  Resp 18  SpO2 100% Physical Exam  Constitutional: He is oriented to person, place, and time.  HENT:  Right Ear: External ear normal.  Left Ear: External ear normal.  Nose: Nose normal.  Mouth/Throat: Oropharynx is clear and moist. No oropharyngeal exudate.  Eyes: Conjunctivae and EOM are normal. Pupils are equal, round, and reactive to light.  Neck: Normal range of motion. Neck supple.  Cardiovascular: Normal rate, regular rhythm, normal heart sounds and intact distal pulses.   Pulmonary/Chest: Effort normal and breath sounds normal. No respiratory distress. He has no wheezes. He has no rales. He exhibits tenderness.    Abdominal: Soft. Bowel sounds are normal. He exhibits no distension. There is no tenderness. There is no rebound and no guarding.  Musculoskeletal: He exhibits no edema.  Neurological: He is alert and oriented to person, place, and time. No cranial nerve deficit.  Skin: Skin is warm and dry.  Psychiatric: He has a normal mood and affect.  Nursing note and vitals reviewed.   ED Course  Procedures (including critical care time) Labs Review Labs Reviewed  BASIC METABOLIC PANEL - Abnormal; Notable for the following:    Glucose, Bld 106 (*)    Creatinine, Ser  1.43 (*)    All other components within normal limits  CBC  I-STAT TROPOININ, ED    Imaging Review Dg Chest 2 View  03/30/2016  CLINICAL DATA:  Shortness of breath, left anterior chest pain for 1 week EXAM: CHEST  2 VIEW COMPARISON:  09/04/2004 FINDINGS: Cardiomediastinal silhouette is stable. No acute infiltrate or pulmonary edema. 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 Old avulsed bony fragment just inferior to distal left clavicle. IMPRESSION: No acute infiltrate or pulmonary edema. 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 Old avulsed bony fragment just inferior to distal left clavicle. Electronically Signed   By: Lahoma Crocker M.D.   On: 03/30/2016 10:00   I have personally reviewed and evaluated these images and lab results as part of my medical decision-making.   EKG Interpretation   Date/Time:  Thursday Mar 30 2016 09:24:09 EDT Ventricular Rate:  70 PR Interval:  138 QRS Duration: 84 QT Interval:  383 QTC Calculation: 413 R Axis:   81 Text Interpretation:  Sinus rhythm ST elev, probable normal early repol  pattern Baseline wander in lead(s) V3 agree. no old comparison. Confirmed  by Johnney Killian, MD, Jeannie Done [phone]) on 03/30/2016 10:18:09 AM      MDM   Final diagnoses:  Atypical chest pain    Suspect msk pain given history, clinical course, and tenderness on exam. EKG nonacute. HEART score 2. Doubt ACS. Will trial NSAIDs and muscle relaxants. Resource guide given for PCP f/u. ER return precautions given.     Anne Ng, PA-C 03/30/16 1135  Charlesetta Shanks, MD 03/30/16 [phone]

## 2016-03-30 NOTE — ED Notes (Signed)
Discharge instructions, follow up care, and rx x2 reviewed with patient. Patient verbalized understanding. 

## 2016-03-30 NOTE — Discharge Instructions (Signed)
Take medications as prescribed. Return to the emergency room for worsening condition or new concerning symptoms. Follow up with your regular doctor. If you don't have a regular doctor use one of the numbers below to establish a primary care doctor. ° ° °Emergency Department Resource Guide °1) Find a Doctor and Pay Out of Pocket °Although you won't have to find out who is covered by your insurance plan, it is a good idea to ask around and get recommendations. You will then need to call the office and see if the doctor you have chosen will accept you as a new patient and what types of options they offer for patients who are self-pay. Some doctors offer discounts or will set up payment plans for their patients who do not have insurance, but you will need to ask so you aren't surprised when you get to your appointment. ° °2) Contact Your Local Health Department °Not all health departments have doctors that can see patients for sick visits, but many do, so it is worth a call to see if yours does. If you don't know where your local health department is, you can check in your phone book. The CDC also has a tool to help you locate your state's health department, and many state websites also have listings of all of their local health departments. ° °3) Find a Walk-in Clinic °If your illness is not likely to be very severe or complicated, you may want to try a walk in clinic. These are popping up all over the country in pharmacies, drugstores, and shopping centers. They're usually staffed by nurse practitioners or physician assistants that have been trained to treat common illnesses and complaints. They're usually fairly quick and inexpensive. However, if you have serious medical issues or chronic medical problems, these are probably not your best option. ° °No Primary Care Doctor: °- Call [facility] at  832-8000 - they can help you locate a primary care doctor that  accepts your insurance, provides certain services,  etc. °- Physician Referral Service- 1-800-533-3463 ° °Emergency Department Resource Guide °1) Find a Doctor and Pay Out of Pocket °Although you won't have to find out who is covered by your insurance plan, it is a good idea to ask around and get recommendations. You will then need to call the office and see if the doctor you have chosen will accept you as a new patient and what types of options they offer for patients who are self-pay. Some doctors offer discounts or will set up payment plans for their patients who do not have insurance, but you will need to ask so you aren't surprised when you get to your appointment. ° °2) Contact Your Local Health Department °Not all health departments have doctors that can see patients for sick visits, but many do, so it is worth a call to see if yours does. If you don't know where your local health department is, you can check in your phone book. The CDC also has a tool to help you locate your state's health department, and many state websites also have listings of all of their local health departments. ° °3) Find a Walk-in Clinic °If your illness is not likely to be very severe or complicated, you may want to try a walk in clinic. These are popping up all over the country in pharmacies, drugstores, and shopping centers. They're usually staffed by nurse practitioners or physician assistants that have been trained to treat common illnesses and complaints. They're usually fairly   quick and inexpensive. However, if you have serious medical issues or chronic medical problems, these are probably not your best option. ° °No Primary Care Doctor: °- Call [facility] at  832-8000 - they can help you locate a primary care doctor that  accepts your insurance, provides certain services, etc. °- Physician Referral Service- 1-800-533-3463 ° °Chronic Pain Problems: °Organization         Address  Phone   Notes  °[facility]  ([phone]) [phone] Patients need to be referred by  their primary care doctor.  ° °Medication Assistance: °Organization         Address  Phone   Notes  °[place] [facility] [address] °[place], [place] [address] ([phone]) 641-8030 --Must be a resident of [place] °-- Must have NO insurance coverage whatsoever (no Medicaid/ Medicare, etc.) °-- The pt. MUST have a primary care doctor that directs their care regularly and follows them in the community °  °[facility]  (866) 331-1348   °[employer]  (888) 892-1162   ° °Agencies that provide inexpensive medical care: °Organization         Address  Phone   Notes  °[facility]  ([phone]) [phone]   °[facility]    (336) 832-7272   °[facility] [address] °[place], [place] [address] (336) 832-4777   °[facility] [address], °[place] (336) [phone]   °[facility]    (336) 373-0678   °[facility]    (336) 272-1050   °[facility] ° [address], [place] Phone:  (336) 832-4444, Fax:  (336) [phone] Hours of Operation:  9 am - 6 pm, M-F.  Also accepts Medicaid/Medicare and self-pay.  °[facility] ° [address], [place] Phone: (336) [phone], Fax: (336) [phone]. Hours of Operation:  8:30 am - 5:30 pm, M-F.  Also accepts Medicaid and self-pay.  °[facility] [place] [address], [place] Phone: (336) [phone]   °[facility] [address], [place], [place] (336)723-1848, Ext. 123 Mondays & Thursdays: 7-9 AM.  First 15 patients are seen on a first come, first serve basis. °  ° °Medicaid-accepting [place] Providers: ° °Organization         Address  Phone   Notes  °[facility] [address], [place] ([phone]) [phone] Also accepts self-pay patients.  °[facility] [address], [place] ° (336) [phone]   °[facility]  [address], [place] (336) [phone]   °[facility] [address] [place] [address], [place] (336) [phone]   °[facility] [address], [place]  ° (336) [phone] Only accepts [place] Access Medicaid patients after they have their name applied to their card.  ° °Self-Pay (no insurance) in [place]: ° °Organization         Address  Phone   Notes  °Sickle Cell Patients, [facility] [address], [place] (336) [phone]   °[facility] [address], [place] (336) [phone]   °[facility] ° [address], [place] (336) [phone]   °[facility]/Dr. Osei-Bonsu ° [address] [place] [address],  or [address], [place] ([phone]) [phone] Phone number   for both [place] and [place] locations is the same.  °[facility] [address], [place] (336) [phone]   °[facility] [address] [place] [address], [place] or [address] (336) 852-7530 °(336) 878-2260   °[facility] [address], [place] (336) 350-1642, phone; (336) [phone], fax Sees patients 1st and 3rd Saturday of every month.  Must not qualify for public or private insurance (i.e. Medicaid, Medicare, [place] Health Choice, Veterans' Benefits) • Household income should be no more than 200% of the poverty level •The clinic cannot treat you if you are pregnant or think you are pregnant • Sexually transmitted diseases are not treated at the clinic.  ° ° ° °
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
## 2018-03-14 ENCOUNTER — Emergency Department (HOSPITAL_COMMUNITY)
Admission: EM | Admit: 2018-03-14 | Discharge: 2018-03-14 | Disposition: A | Discharge status: Home / Self Care | Payer: Self-pay

## 2018-03-14 NOTE — ED Notes (Signed)
Patient called for triage, did not answer x3

## 2018-03-15 ENCOUNTER — Encounter (HOSPITAL_COMMUNITY): Payer: Self-pay | Admitting: *Deleted

## 2018-03-15 ENCOUNTER — Other Ambulatory Visit: Payer: Self-pay

## 2018-03-15 ENCOUNTER — Emergency Department (HOSPITAL_COMMUNITY): Payer: Self-pay

## 2018-03-15 ENCOUNTER — Emergency Department (HOSPITAL_COMMUNITY)
Admission: EM | Admit: 2018-03-15 | Discharge: 2018-03-15 | Disposition: A | Discharge status: Home / Self Care | Payer: Self-pay | Attending: Emergency Medicine | Admitting: Emergency Medicine

## 2018-03-15 DIAGNOSIS — F17210 Nicotine dependence, cigarettes, uncomplicated: Secondary | ICD-10-CM | POA: Insufficient documentation

## 2018-03-15 DIAGNOSIS — M533 Sacrococcygeal disorders, not elsewhere classified: Secondary | ICD-10-CM | POA: Insufficient documentation

## 2018-03-15 DIAGNOSIS — Z79899 Other long term (current) drug therapy: Secondary | ICD-10-CM | POA: Insufficient documentation

## 2018-03-15 DIAGNOSIS — W19XXXA Unspecified fall, initial encounter: Secondary | ICD-10-CM

## 2018-03-15 DIAGNOSIS — Y929 Unspecified place or not applicable: Secondary | ICD-10-CM | POA: Insufficient documentation

## 2018-03-15 DIAGNOSIS — W132XXA Fall from, out of or through roof, initial encounter: Secondary | ICD-10-CM | POA: Insufficient documentation

## 2018-03-15 DIAGNOSIS — Y9389 Activity, other specified: Secondary | ICD-10-CM | POA: Insufficient documentation

## 2018-03-15 DIAGNOSIS — Y990 Civilian activity done for income or pay: Secondary | ICD-10-CM | POA: Insufficient documentation

## 2018-03-15 MED ORDER — CYCLOBENZAPRINE HCL 10 MG PO TABS
10.0000 mg | ORAL_TABLET | Freq: Once | ORAL | Status: AC
  Administered 2018-03-15: 10 mg via ORAL
  Filled 2018-03-15: qty 1

## 2018-03-15 MED ORDER — ACETAMINOPHEN 325 MG PO TABS: 650.0000 mg | ORAL_TABLET | Freq: Four times a day (QID) | ORAL | 0 refills | Status: DC | PRN

## 2018-03-15 MED ORDER — OXYCODONE-ACETAMINOPHEN 5-325 MG PO TABS
1.0000 | ORAL_TABLET | ORAL | Status: DC | PRN
  Administered 2018-03-15: 1 via ORAL
  Filled 2018-03-15: qty 1

## 2018-03-15 MED ORDER — CYCLOBENZAPRINE HCL 10 MG PO TABS: 10.0000 mg | ORAL_TABLET | Freq: Two times a day (BID) | ORAL | 0 refills | Status: DC | PRN

## 2018-03-15 MED ORDER — IBUPROFEN 600 MG PO TABS: 600.0000 mg | ORAL_TABLET | Freq: Four times a day (QID) | ORAL | 0 refills | Status: DC | PRN

## 2018-03-15 NOTE — ED Triage Notes (Signed)
States he fell approx. 8 ft. Out of a attic Monday c/o pain in his lower back. Pain is getting worse.

## 2018-03-15 NOTE — ED Provider Notes (Signed)
[facility] EMERGENCY DEPARTMENT Provider Note   CSN: [id number] Arrival date & time: 03/15/18  4818     History   Chief Complaint Chief Complaint  Patient presents with  . Fall    HPI Larry Glover is a 32 y.o. male.  HPI   Patient is a 32 year old male with no significant chronic medical conditions presenting for low back pain status post fall 4 days ago.  Patient reports he works in [employer], and fell out of an attic approximately 8 to [employer] above the ground 4 days ago.  Patient reports he fell directly onto his buttocks.  Patient reports he has had significant midline lumbar spine pain that worsened yesterday and today, as well as radiating down into the buttocks.  Patient reports that the pain was so severe he had to lift his legs out of bed today.  Patient denies any numbness in the lower extremities, foot drop or other muscular weakness, saddle anesthesia, loss of bowel or bladder control.  Patient denies any head trauma occurred in this event, and did not hit any other region of his body besides the low back and buttocks and this fall.  Patient was ambulatory immediately, and has been ambulatory since the incident.  No wounds sustained in this fall.  Past Medical History:  Diagnosis Date  . [facility] spotted fever     There are no active problems to display for this patient.   Past Surgical History:  Procedure Laterality Date  . BACK SURGERY          Home Medications    Prior to Admission medications   Medication Sig Start Date End Date Taking? Authorizing Provider  diazepam (VALIUM) 5 MG tablet Take 1 tablet (5 mg total) by mouth 2 (two) times daily. 03/30/16   Sam, Olivia Canter, PA-C  ibuprofen (ADVIL,MOTRIN) 800 MG tablet Take 1 tablet (800 mg total) by mouth 3 (three) times daily. 03/30/16   Sam, Olivia Canter, PA-C  trimethoprim-polymyxin b (POLYTRIM) ophthalmic solution Place 1 drop into the left eye every 4 (four) hours. Patient not taking:  Reported on 03/30/2016 05/12/14   Pisciotta, Charna Elizabeth    Family History No family history on file.  Social History Social History   Tobacco Use  . Smoking status: Current Every Day Smoker    Packs/day: 1.00    Types: Cigarettes  . Smokeless tobacco: Never Used  Substance Use Topics  . Alcohol use: Yes    Comment: social  . Drug use: No     Allergies   Patient has no known allergies.   Review of Systems Review of Systems  Constitutional: Negative for chills and fever.  Gastrointestinal: Negative for abdominal pain.  Genitourinary: Negative for decreased urine volume, difficulty urinating and urgency.  Musculoskeletal: Positive for arthralgias and back pain. Negative for gait problem.  Skin: Negative for wound.  Neurological: Negative for syncope, weakness, numbness and headaches.  All other systems reviewed and are negative.    Physical Exam Updated Vital Signs BP 106/75 (BP Location: Right Arm)   Pulse 61   Temp 97.9 F (36.6 C) (Oral)   Resp 16   Ht 5\' 11"  (1.803 m)   Wt 83 kg (183 lb)   SpO2 98%   BMI 25.52 kg/m   Physical Exam  Constitutional: He appears well-developed and well-nourished. No distress.  Sitting comfortably in bed.  HENT:  Head: Normocephalic and atraumatic.  Eyes: Conjunctivae are normal. Right eye exhibits no discharge. Left  eye exhibits no discharge.  EOMs normal to gross examination.  Neck: Normal range of motion.  Cardiovascular: Normal rate and regular rhythm.  Intact, 2+ DP and PT pulses bilaterally.  Pulmonary/Chest:  Normal respiratory effort. Patient converses comfortably. No audible wheeze or stridor.  Abdominal: He exhibits no distension.  Musculoskeletal: Normal range of motion.  Spine Exam: Inspection/Palpation: No midline tenderness of cervical or thoracic spine.  Patient does exhibit midline tenderness approximately over the L4-L5 region.  Patient also exhibits point tenderness of her coccyx.  Examination performed  with EMT chaperone Shanon Brow. Strength: 5/5 throughout LE bilaterally (hip flexion/extension, adduction/abduction; knee flexion/extension; foot dorsiflexion/plantarflexion, inversion/eversion; great toe inversion) Sensation: Intact to light/sharp touch in proximal and distal LE bilaterally Reflexes: 2+ quadriceps and achilles reflexes Normal and symmetric gait with no evidence of weakness or footdrop.    Neurological: He is alert.  Cranial nerves intact to gross observation. Patient moves extremities without difficulty.  Skin: Skin is warm and dry. He is not diaphoretic.  Psychiatric: He has a normal mood and affect. His behavior is normal. Judgment and thought content normal.  Nursing note and vitals reviewed.    ED Treatments / Results  Labs (all labs ordered are listed, but only abnormal results are displayed) Labs Reviewed - No data to display  EKG None  Radiology Dg Lumbar Spine Complete  Result Date: 03/15/2018 CLINICAL DATA:  Acute low back pain following fall 4 days ago. Initial encounter. EXAM: LUMBAR SPINE - COMPLETE 4+ VIEW COMPARISON:  None. FINDINGS: There is no evidence of acute fracture subluxation. Mild degenerative disc disease at L2-3 noted. No focal bony lesions or spondylolysis. IMPRESSION: No acute abnormality. Mild degenerative disc disease at L2-3. Electronically Signed   By: Margarette Canada M.D.   On: 03/15/2018 08:47    Procedures Procedures (including critical care time)  Medications Ordered in ED Medications  oxyCODONE-acetaminophen (PERCOCET/ROXICET) 5-325 MG per tablet 1 tablet (1 tablet Oral Given 03/15/18 0748)  cyclobenzaprine (FLEXERIL) tablet 10 mg (has no administration in time range)     Initial Impression / Assessment and Plan / ED Course  I have reviewed the triage vital signs and the nursing notes.  Pertinent labs & imaging results that were available during my care of the patient were reviewed by me and considered in my medical decision making  (see chart for details).  Clinical Course as of Mar 16 1419  Fri Mar 15, 2018  1138 Spoke with CT tech, and there is no indication for T-spine imaging as patient is nontender in the T-spine.   [AM]  1154 Patient verbally verified a safe ride from the ED. Proceeded with prescribing Flexeril for pain/relaxtion/muscle relaxation in the ED.   [AM]  1218 Spoke with CT tech, and will go back for additional imaging, without having to CT the entire pelvis.  She will take more images just inferior to where the lumbar spine CT ends.   [AM]  1420 Spoke with Dr. Earleen Newport of [facility] radiology, who confirms that he was able to see far enough down into the sacral spine and confirm that there is no fracture in the noted segment of concern on the re-x-ray.   [AM]    Clinical Course User Index [AM] Albesa Seen, PA-C    Patient well-appearing but shifting around constantly to find comfortable position.  Patient with point tenderness overlying lower lumbar spine as well as coccyx.  Additional imaging with CT of the lumbar spine, as well as radiographs of coccyx ordered due  to physical exam.  CT of the lumbar spine that extended onto the sacral region confirms that there is no fracture.  Additionally, spoke with Dr. Earleen Newport as documented above to confirm that there is no fracture of the sacral region of concern on x-ray.  Patient without complaints prior to discharge, and is ambulatory and in no acute distress.  Patient prescribed muscle relaxants, and was instructed not to drive, drink, operate machinery.  Patient to follow-up with his company physician for full clearance as needed.  Patient is in understanding and agrees with the plan of care.  Final Clinical Impressions(s) / ED Diagnoses   Final diagnoses:  Fall, initial encounter  Sacral pain    ED Discharge Orders        Ordered    cyclobenzaprine (FLEXERIL) 10 MG tablet  2 times daily PRN     03/15/18 1416    ibuprofen (ADVIL,MOTRIN) 600 MG  tablet  Every 6 hours PRN     03/15/18 1416    acetaminophen (TYLENOL) 325 MG tablet  Every 6 hours PRN     03/15/18 1416       Albesa Seen, PA-C 03/15/18 1421    Virgel Manifold, MD 03/18/18 [phone]

## 2018-03-15 NOTE — Discharge Instructions (Addendum)
Please see the information and instructions below regarding your visit.  Your diagnoses today include:  1. Fall, initial encounter   2. Sacral pain    About diagnosis. Most episodes of acute low back pain are self-limited. Your exam was reassuring today that the source of your pain is not affecting the spinal cord and nerves that originate in the spinal cord.   Tests performed today include: See side panel of your discharge paperwork for testing performed today. Vital signs are listed at the bottom of these instructions.   Your CT scan confirmed that you have no fracture.  Medications prescribed:    Take any prescribed medications only as prescribed, and any over the counter medications only as directed on the packaging.  You are prescribed Flexeril, a muscle relaxant. Some common side effects of this medication include:  Feeling sleepy.  Dizziness. Take care upon going from a seated to a standing position.  Dry mouth.  Feeling tired or weak.  Hard stools (constipation).  Upset stomach. These are not all of the side effects that may occur. If you have questions about side effects, call your doctor. Call your primary care provider for medical advice about side effects.  This medication can be sedating. Only take this medication as needed. Please do not combine with alcohol. Do not drive or operate machinery while taking this medication.   This medication can interact with some other medications. Make sure to tell any provider you are taking this medication before they prescribe you a new medication.   You are prescribed Flexeril, a non-steroidal anti-inflammatory agent (NSAID) for pain. You may take 600 mg every 6 hours as needed for pain. If still requiring this medication around the clock for acute pain after 10 days, please see your primary healthcare provider.  Women who are pregnant, breastfeeding, or planning on becoming pregnant should not take non-steroidal anti-inflammatories  such as   You may combine this medication with Tylenol, 650 mg every 6 hours, so you are receiving something for pain every 3 hours.  This is not a long-term medication unless under the care and direction of your primary provider. Taking this medication long-term and not under the supervision of a healthcare provider could increase the risk of stomach ulcers, kidney problems, and cardiovascular problems such as high blood pressure.   Home care instructions:   Low back pain gets worse the longer you stay stationary. Please keep moving and walking as tolerated. There are exercises included in this packet to perform as tolerated for your low back pain.   Apply heat to the areas that are painful. Avoid twisting or bending your trunk to lift something. Do not lift anything above 25 lbs while recovering from this flare of low back pain.  Please follow any educational materials contained in this packet.   Follow-up instructions: Please follow-up with your work physician, to be fully cleared if you are having persistent pain next week.  Return instructions:  Please return to the Emergency Department if you experience worsening symptoms.  Please return for any fever or chills in the setting of your back pain, weakness in the muscles of the legs, numbness in your legs and feet that is new or changing, numbness in the area where you wipe, retention of your urine, loss of bowel or bladder control, or problems with walking. Please return if you have any other emergent concerns.  Additional Information:   Your vital signs today were: BP 106/75 (BP Location: Right Arm)  Pulse 61    Temp 97.9 F (36.6 C) (Oral)    Resp 16    Ht 5\' 11"  (1.803 m)    Wt 83 kg (183 lb)    SpO2 98%    BMI 25.52 kg/m  If your blood pressure (BP) was elevated on multiple readings during this visit above 130 for the top number or above 80 for the bottom number, please have this repeated by your primary care provider within  one month. --------------  Thank you for allowing [place] to participate in your care today.

## 2018-03-15 NOTE — ED Notes (Signed)
Pt did not answer when called  x2

## 2018-03-15 NOTE — ED Notes (Signed)
Patient transported to X-ray 

## 2018-03-15 NOTE — ED Notes (Signed)
Patient verbalizes understanding of discharge instructions. Opportunity for questioning and answers were provided. Armband removed by staff, pt discharged from ED ambulatory.   

## 2018-03-15 NOTE — ED Notes (Signed)
Ortho paged for donut cushion

## 2018-06-02 ENCOUNTER — Other Ambulatory Visit: Payer: Self-pay

## 2018-06-02 ENCOUNTER — Encounter (HOSPITAL_COMMUNITY): Payer: Self-pay | Admitting: Emergency Medicine

## 2018-06-02 ENCOUNTER — Emergency Department (HOSPITAL_COMMUNITY)
Admission: EM | Admit: 2018-06-02 | Discharge: 2018-06-02 | Disposition: A | Discharge status: Home / Self Care | Payer: Self-pay | Attending: Emergency Medicine | Admitting: Emergency Medicine

## 2018-06-02 DIAGNOSIS — F17210 Nicotine dependence, cigarettes, uncomplicated: Secondary | ICD-10-CM | POA: Insufficient documentation

## 2018-06-02 DIAGNOSIS — K148 Other diseases of tongue: Secondary | ICD-10-CM

## 2018-06-02 DIAGNOSIS — Z79899 Other long term (current) drug therapy: Secondary | ICD-10-CM | POA: Insufficient documentation

## 2018-06-02 DIAGNOSIS — K1370 Unspecified lesions of oral mucosa: Secondary | ICD-10-CM | POA: Insufficient documentation

## 2018-06-02 MED ORDER — LIDOCAINE HCL 2 % EX LIQD: CUTANEOUS | 0 refills | Status: DC

## 2018-06-02 NOTE — ED Provider Notes (Signed)
[facility] EMERGENCY DEPARTMENT Provider Note   CSN: [id number] Arrival date & time: 06/02/18  1642     History   Chief Complaint Chief Complaint  Patient presents with  . Sore in Mouth    HPI Larry Glover is a 32 y.o. male presents today for evaluation of acute onset, progressively worsening sore of the tongue for 7 months.  He states that he noticed this sometime in January earlier this year.  He states he noticed a lesion to the left lateral edge of the tongue which he described as "almost like a pimple ".  He states that since then the lesion has grown into a shallow ulceration and is quite tender.  He notes a sharp stinging pain to this lesion which worsens with exposure to acidic foods, spicy foods, and when he smokes.  He currently smokes approximately pack of cigarettes daily.  He has been swishing with hydrogen peroxide and Listerine without relief of his symptoms.  He has also attempted to use his grandmothers Peridex mouthwash without relief.  Denies throat swelling but thinks that his tongue feels a little bit more swollen as a result of the lesion.  He denies drooling, fevers, sore throat, neck pain.  The history is provided by the patient.    Past Medical History:  Diagnosis Date  . [facility] spotted fever     There are no active problems to display for this patient.   Past Surgical History:  Procedure Laterality Date  . BACK SURGERY          Home Medications    Prior to Admission medications   Medication Sig Start Date End Date Taking? Authorizing Provider  acetaminophen (TYLENOL) 325 MG tablet Take 2 tablets (650 mg total) by mouth every 6 (six) hours as needed. 03/15/18   Langston Masker B, PA-C  cyclobenzaprine (FLEXERIL) 10 MG tablet Take 1 tablet (10 mg total) by mouth 2 (two) times daily as needed for muscle spasms. 03/15/18   Langston Masker B, PA-C  diazepam (VALIUM) 5 MG tablet Take 1 tablet (5 mg total) by mouth 2 (two)  times daily. 03/30/16   Sam, Olivia Canter, PA-C  ibuprofen (ADVIL,MOTRIN) 600 MG tablet Take 1 tablet (600 mg total) by mouth every 6 (six) hours as needed. 03/15/18   Langston Masker B, PA-C  Lidocaine HCl 2 % LIQD 15 mL swished in the mouth and spit out no more frequently than every 6 hour 06/02/18   Lucillie Kiesel A, PA-C  trimethoprim-polymyxin b (POLYTRIM) ophthalmic solution Place 1 drop into the left eye every 4 (four) hours. Patient not taking: Reported on 03/30/2016 05/12/14   Pisciotta, Charna Elizabeth    Family History No family history on file.  Social History Social History   Tobacco Use  . Smoking status: Current Every Day Smoker    Packs/day: 1.00    Types: Cigarettes  . Smokeless tobacco: Never Used  Substance Use Topics  . Alcohol use: Yes    Comment: social  . Drug use: No     Allergies   Patient has no known allergies.   Review of Systems Review of Systems  Constitutional: Negative for chills and fever.  HENT: Positive for mouth sores. Negative for drooling, sore throat and trouble swallowing.   Skin: Positive for wound.     Physical Exam Updated Vital Signs BP 101/77 (BP Location: Right Arm)   Pulse 91   Temp 98 F (36.7 C) (Oral)   Resp 16  SpO2 99%   Physical Exam  Constitutional: He appears well-developed and well-nourished. No distress.  Resting comfortably in chair  HENT:  Head: Normocephalic and atraumatic.  See attached image.  Patient has a 5 mm shallow ulceration to the left lateral edge of the tongue.  There is surrounding hypertrophic tissue.  No bleeding.  Mildly tender.  Tolerating oral secretions without difficulty.  Linea alba noted to the buccal mucosa bilaterally.  Eyes: Conjunctivae are normal. Right eye exhibits no discharge. Left eye exhibits no discharge.  Neck: Normal range of motion. Neck supple. No JVD present. No tracheal deviation present.  No upper airway stridor  Cardiovascular: Normal rate.  Pulmonary/Chest: Effort normal.    Abdominal: He exhibits no distension.  Musculoskeletal: He exhibits no edema.  Lymphadenopathy:    He has no cervical adenopathy.  Neurological: He is alert.  Skin: Skin is warm and dry. No erythema.  Psychiatric: He has a normal mood and affect. His behavior is normal.  Nursing note and vitals reviewed.       ED Treatments / Results  Labs (all labs ordered are listed, but only abnormal results are displayed) Labs Reviewed - No data to display  EKG None  Radiology No results found.  Procedures Procedures (including critical care time)  Medications Ordered in ED Medications - No data to display   Initial Impression / Assessment and Plan / ED Course  I have reviewed the triage vital signs and the nursing notes.  Pertinent labs & imaging results that were available during my care of the patient were reviewed by me and considered in my medical decision making (see chart for details).     Patient presents with painful tongue lesion for the past 7 months.  He is afebrile, vital signs are stable.  He is nontoxic in appearance.  He has a long-standing history of smoking.  No signs of secondary skin infection.  Airway appears patent and neck is supple.  Suspicious for neoplasm although also considered contact wound from persistent trauma or significant dentition.  Recommend follow-up with ENT for biopsy and further follow-up.  We will give lidocaine to apply topically on occasion for pain.  Advised of appropriate use of this medication and discussed the possibility for lidocaine toxicity if he overuses his medicine.  Discussed strict ED return precautions. Pt verbalized understanding of and agreement with plan and is safe for discharge home at this time.  Final Clinical Impressions(s) / ED Diagnoses   Final diagnoses:  Tongue lesion    ED Discharge Orders        Ordered    Lidocaine HCl 2 % LIQD     06/02/18 1819       Renita Papa, PA-C 06/02/18 1821    Mesner,  Corene Cornea, MD 06/02/18 [phone]

## 2018-06-02 NOTE — ED Triage Notes (Signed)
Pt reports a sore in his mouth that has been there for months, "I keep thinking it will go away but it does"

## 2018-06-02 NOTE — Discharge Instructions (Addendum)
Apply lidocaine to the affected area no more than 4 times daily.  I recommend using a Q-tip or cotton swab to apply just a small amount.  Do not use this for an extended period of time.  You can purchase a mouthguard if you wish.  Follow-up with ENT (ears nose throat doctor) for biopsy.  I have also attached resources for follow-up with a dentist on an outpatient basis.  I do recommend that you quit smoking.  Return to the emergency department if any concerning signs or symptoms develop such as fevers, difficulty breathing or swallowing, or throat tightness

## 2018-06-14 ENCOUNTER — Other Ambulatory Visit (HOSPITAL_COMMUNITY): Payer: Self-pay | Admitting: Otolaryngology

## 2018-06-14 DIAGNOSIS — C029 Malignant neoplasm of tongue, unspecified: Secondary | ICD-10-CM

## 2018-06-19 ENCOUNTER — Encounter (HOSPITAL_COMMUNITY): Payer: Self-pay

## 2018-06-19 ENCOUNTER — Ambulatory Visit (HOSPITAL_COMMUNITY)
Admission: RE | Admit: 2018-06-19 | Discharge: 2018-06-19 | Disposition: A | Discharge status: Home / Self Care | Payer: Medicaid Other | Source: Ambulatory Visit | Attending: Otolaryngology | Admitting: Otolaryngology

## 2018-06-19 DIAGNOSIS — C029 Malignant neoplasm of tongue, unspecified: Secondary | ICD-10-CM | POA: Insufficient documentation

## 2018-06-19 MED ORDER — IOHEXOL 300 MG/ML  SOLN
75.0000 mL | Freq: Once | INTRAMUSCULAR | Status: AC | PRN
  Administered 2018-06-19: 75 mL via INTRAVENOUS

## 2018-06-25 MED FILL — CHLORHEXIDINE 0.12% RINSE: 0.12 | 23 days supply | Qty: 473 | Fill #0

## 2018-06-25 MED FILL — HYDROCODON-APAP 5-325: 5-325 | 10 days supply | Qty: 40 | Fill #0

## 2018-06-26 DIAGNOSIS — C029 Malignant neoplasm of tongue, unspecified: Secondary | ICD-10-CM | POA: Diagnosis present

## 2018-06-26 NOTE — H&P (Signed)
Otolaryngology Clinic Note  HPI:    Larry Glover is a 32 y.o. male patient of Patient Does Not Have Pcp for evaluation of left tongue cancer.  3-week recheck.  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  He feels like the lesion is getting more distinct, painful, and larger.  It is beginning to give him difficulty with swallowing.  No breathing issues.  No neck masses.  CT scan of the neck did not show any adenopathy.  the primary lesion is too superficial to see.  We have not asked for a PET scan thus far.  I recommend a wide local excision of the lesion with possible frozen section control.  I discussed the surgery in detail including risks and complications.  Questions were answered and informed consent was obtained.  He may be able to go home from the hospital on the same day or at worst the very next day.  I will see him here 1 week afterwards. PMH/Meds/All/SocHx/FamHx/ROS:   Past Medical History  History reviewed. No pertinent past medical history.    Past Surgical History       Past Surgical History:  Procedure Laterality Date  . NO PAST SURGERIES        No family history of bleeding disorders, wound healing problems or difficulty with anesthesia.   Social History  Social History        Social History  . Marital status: Married    Spouse name: N/A  . Number of children: N/A  . Years of education: N/A      Occupational History  . Not on file.         Social History Main Topics  . Smoking status: Current Every Day Smoker    Types: Cigarettes  . Smokeless tobacco: Never Used  . Alcohol use Yes     Comment: 0cc.  . Drug use: No  . Sexual activity: Not on file       Other Topics Concern  . Not on file      Social History Narrative  . No narrative on file       Current Outpatient Prescriptions:  .  chlorhexidine (PERIDEX) 0.12 % solution, Use as directed 5 mLs in the mouth or throat 4 times daily  for 14 days., Disp: 300 mL, Rfl: 0 .  HYDROcodone-acetaminophen (NORCO) 5-325 mg per tablet, Take 1 tablet by mouth every 6 (six) hours as needed for up to 5 days., Disp: 40 tablet, Rfl: 0  A complete ROS was performed with pertinent positives/negatives noted in the HPI. The remainder of the ROS are negative.    Physical Exam:    There were no vitals taken for this visit. He is healthy.  There is a 12 mm punched-out lesion on the lateral left tongue with some hyperkeratosis surrounding it and along the lateral margin.  Tongue mobility is good.  No lymphadenopathy. Lungs: Clear to auscultation Heart: Regular rate and rhythm without murmurs Abdomen: Soft, active Extremities: Normal configuration Neurologic: Symmetric, grossly intact.       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    Plan: We will proceed with wide local excision.  I sent in a prescription for hydrocodone tablets to use now.  He can use these postoperatively unless he thinks he would have an easier time swallowing liquid medication.  I will see him here 1 week after surgery.  I also sent in a prescription  for Peridex solution for postoperative use.   Lilyan Gilford, MD  [id number]/[id number]

## 2018-06-27 ENCOUNTER — Encounter (HOSPITAL_COMMUNITY): Payer: Self-pay | Admitting: *Deleted

## 2018-06-27 ENCOUNTER — Other Ambulatory Visit: Payer: Self-pay

## 2018-06-27 NOTE — Progress Notes (Signed)
Pt denies SOB, chest pain, and being under the care of a cardiologist. Pt denies having a stress test, echo and cardiac cath. Pt denies having an EKG and chest x ray within the last year. Pt denies recent labs. Pt made aware to stop taking  Aspirin, vitamins, fish oil and herbal medications. Do not take any NSAIDs ie: Ibuprofen, Advil, Naproxen (Aleve), Motrin, BC and Goody Powder. Pt verbalized understanding of all pre-op instructions. 

## 2018-06-28 ENCOUNTER — Encounter (HOSPITAL_COMMUNITY): Payer: Self-pay

## 2018-06-28 ENCOUNTER — Ambulatory Visit (HOSPITAL_COMMUNITY): Payer: Medicaid Other | Admitting: Anesthesiology

## 2018-06-28 ENCOUNTER — Encounter (HOSPITAL_COMMUNITY)
Admission: RE | Disposition: A | Discharge status: Home / Self Care | Payer: Self-pay | Source: Ambulatory Visit | Attending: Otolaryngology

## 2018-06-28 ENCOUNTER — Ambulatory Visit (HOSPITAL_COMMUNITY)
Admission: RE | Admit: 2018-06-28 | Discharge: 2018-06-28 | Disposition: A | Discharge status: Home / Self Care | Payer: Medicaid Other | Source: Ambulatory Visit | Attending: Otolaryngology | Admitting: Otolaryngology

## 2018-06-28 DIAGNOSIS — C021 Malignant neoplasm of border of tongue: Secondary | ICD-10-CM

## 2018-06-28 DIAGNOSIS — K1321 Leukoplakia of oral mucosa, including tongue: Secondary | ICD-10-CM | POA: Insufficient documentation

## 2018-06-28 DIAGNOSIS — F17210 Nicotine dependence, cigarettes, uncomplicated: Secondary | ICD-10-CM | POA: Insufficient documentation

## 2018-06-28 DIAGNOSIS — C029 Malignant neoplasm of tongue, unspecified: Secondary | ICD-10-CM | POA: Diagnosis present

## 2018-06-28 HISTORY — PX: EXCISION OF TONGUE LESION: SHX6434

## 2018-06-28 HISTORY — DX: Malignant neoplasm of border of tongue: C02.1

## 2018-06-28 LAB — CBC
HCT: 43 % (ref 39.0–52.0)
Hemoglobin: 15.7 g/dL (ref 13.0–17.0)
MCH: 34.1 pg — AB (ref 26.0–34.0)
MCHC: 36.5 g/dL — AB (ref 30.0–36.0)
MCV: 93.5 fL (ref 78.0–100.0)
PLATELETS: 578 10*3/uL — AB (ref 150–400)
RBC: 4.6 MIL/uL (ref 4.22–5.81)
RDW: 11.9 % (ref 11.5–15.5)
WBC: 5.5 10*3/uL (ref 4.0–10.5)

## 2018-06-28 LAB — BASIC METABOLIC PANEL
Anion gap: 10 (ref 5–15)
BUN: 17 mg/dL (ref 6–20)
CO2: 26 mmol/L (ref 22–32)
CREATININE: 1.17 mg/dL (ref 0.61–1.24)
Calcium: 9.3 mg/dL (ref 8.9–10.3)
Chloride: 104 mmol/L (ref 98–111)
GFR calc Af Amer: >60 mL/min (ref >60)
Glucose, Bld: 100 mg/dL — ABNORMAL HIGH (ref 70–99)
Potassium: 4.3 mmol/L (ref 3.5–5.1)
Sodium: 140 mmol/L (ref 135–145)

## 2018-06-28 SURGERY — EXCISION, LESION, TONGUE
Anesthesia: General | Site: Mouth | Laterality: Left

## 2018-06-28 MED ORDER — ONDANSETRON HCL 4 MG PO TABS: 4.0000 mg | ORAL_TABLET | ORAL | Status: DC | PRN

## 2018-06-28 MED ORDER — HYDROCODONE-ACETAMINOPHEN 5-325 MG PO TABS: 1.0000 | ORAL_TABLET | ORAL | Status: DC | PRN

## 2018-06-28 MED ORDER — MIDAZOLAM HCL 5 MG/5ML IJ SOLN
INTRAMUSCULAR | Status: DC | PRN
  Administered 2018-06-28: 2 mg via INTRAVENOUS

## 2018-06-28 MED ORDER — MIDAZOLAM HCL 2 MG/2ML IJ SOLN
INTRAMUSCULAR | Status: AC
  Filled 2018-06-28: qty 2

## 2018-06-28 MED ORDER — DEXAMETHASONE SODIUM PHOSPHATE 10 MG/ML IJ SOLN
INTRAMUSCULAR | Status: AC
  Filled 2018-06-28: qty 1

## 2018-06-28 MED ORDER — DEXTROSE-NACL 5-0.45 % IV SOLN: INTRAVENOUS | Status: DC

## 2018-06-28 MED ORDER — CEFAZOLIN SODIUM-DEXTROSE 2-4 GM/100ML-% IV SOLN
2.0000 g | INTRAVENOUS | Status: AC
  Administered 2018-06-28: 2 g via INTRAVENOUS

## 2018-06-28 MED ORDER — FENTANYL CITRATE (PF) 100 MCG/2ML IJ SOLN
INTRAMUSCULAR | Status: DC | PRN
  Administered 2018-06-28: 100 ug via INTRAVENOUS
  Administered 2018-06-28: 150 ug via INTRAVENOUS

## 2018-06-28 MED ORDER — SUGAMMADEX SODIUM 200 MG/2ML IV SOLN
INTRAVENOUS | Status: DC | PRN
  Administered 2018-06-28: 200 mg via INTRAVENOUS

## 2018-06-28 MED ORDER — OXYCODONE HCL 5 MG/5ML PO SOLN: 5.0000 mg | Freq: Once | ORAL | Status: DC

## 2018-06-28 MED ORDER — ONDANSETRON HCL 4 MG/2ML IJ SOLN: 4.0000 mg | INTRAMUSCULAR | Status: DC | PRN

## 2018-06-28 MED ORDER — HYDROMORPHONE HCL 1 MG/ML IJ SOLN
INTRAMUSCULAR | Status: AC
  Administered 2018-06-28: 0.5 mg via INTRAVENOUS
  Filled 2018-06-28: qty 1

## 2018-06-28 MED ORDER — CEFAZOLIN SODIUM-DEXTROSE 2-4 GM/100ML-% IV SOLN
INTRAVENOUS | Status: AC
  Filled 2018-06-28: qty 100

## 2018-06-28 MED ORDER — PHENYLEPHRINE 40 MCG/ML (10ML) SYRINGE FOR IV PUSH (FOR BLOOD PRESSURE SUPPORT)
PREFILLED_SYRINGE | INTRAVENOUS | Status: AC
  Filled 2018-06-28: qty 10

## 2018-06-28 MED ORDER — PROPOFOL 10 MG/ML IV BOLUS
INTRAVENOUS | Status: DC | PRN
  Administered 2018-06-28: 200 mg via INTRAVENOUS

## 2018-06-28 MED ORDER — 0.9 % SODIUM CHLORIDE (POUR BTL) OPTIME
TOPICAL | Status: DC | PRN
  Administered 2018-06-28: 1000 mL

## 2018-06-28 MED ORDER — OXYCODONE HCL 5 MG/5ML PO SOLN
5.0000 mg | Freq: Once | ORAL | Status: AC | PRN
  Administered 2018-06-28: 5 mg via ORAL

## 2018-06-28 MED ORDER — ONDANSETRON HCL 4 MG/2ML IJ SOLN
INTRAMUSCULAR | Status: DC | PRN
  Administered 2018-06-28: 4 mg via INTRAVENOUS

## 2018-06-28 MED ORDER — PROPOFOL 10 MG/ML IV BOLUS
INTRAVENOUS | Status: AC
  Filled 2018-06-28: qty 20

## 2018-06-28 MED ORDER — LIDOCAINE 2% (20 MG/ML) 5 ML SYRINGE
INTRAMUSCULAR | Status: DC | PRN
  Administered 2018-06-28: 100 mg via INTRAVENOUS

## 2018-06-28 MED ORDER — OXYMETAZOLINE HCL 0.05 % NA SOLN
NASAL | Status: AC
  Filled 2018-06-28: qty 15

## 2018-06-28 MED ORDER — LACTATED RINGERS IV SOLN
INTRAVENOUS | Status: DC
  Administered 2018-06-28 (×2): via INTRAVENOUS

## 2018-06-28 MED ORDER — HYDROMORPHONE HCL 1 MG/ML IJ SOLN
0.2500 mg | INTRAMUSCULAR | Status: DC | PRN
  Administered 2018-06-28 (×2): 0.5 mg via INTRAVENOUS

## 2018-06-28 MED ORDER — ONDANSETRON HCL 4 MG/2ML IJ SOLN
INTRAMUSCULAR | Status: AC
  Filled 2018-06-28: qty 2

## 2018-06-28 MED ORDER — IBUPROFEN 100 MG/5ML PO SUSP: 400.0000 mg | Freq: Four times a day (QID) | ORAL | Status: DC | PRN

## 2018-06-28 MED ORDER — ROCURONIUM BROMIDE 50 MG/5ML IV SOSY
PREFILLED_SYRINGE | INTRAVENOUS | Status: DC | PRN
  Administered 2018-06-28: 20 mg via INTRAVENOUS
  Administered 2018-06-28: 30 mg via INTRAVENOUS

## 2018-06-28 MED ORDER — DEXAMETHASONE SODIUM PHOSPHATE 10 MG/ML IJ SOLN
INTRAMUSCULAR | Status: DC | PRN
  Administered 2018-06-28: 10 mg via INTRAVENOUS

## 2018-06-28 MED ORDER — ROCURONIUM BROMIDE 50 MG/5ML IV SOSY
PREFILLED_SYRINGE | INTRAVENOUS | Status: AC
  Filled 2018-06-28: qty 10

## 2018-06-28 MED ORDER — OXYCODONE HCL 5 MG/5ML PO SOLN
ORAL | Status: AC
  Filled 2018-06-28: qty 5

## 2018-06-28 MED ORDER — OXYCODONE HCL 5 MG PO TABS: 5.0000 mg | ORAL_TABLET | Freq: Once | ORAL | Status: AC | PRN

## 2018-06-28 MED ORDER — PROMETHAZINE HCL 25 MG/ML IJ SOLN: 6.2500 mg | INTRAMUSCULAR | Status: DC | PRN

## 2018-06-28 MED ORDER — FENTANYL CITRATE (PF) 250 MCG/5ML IJ SOLN
INTRAMUSCULAR | Status: AC
  Filled 2018-06-28: qty 5

## 2018-06-28 MED ORDER — OXYMETAZOLINE HCL 0.05 % NA SOLN
2.0000 | NASAL | Status: DC | PRN
  Administered 2018-06-28: 2 via NASAL
  Filled 2018-06-28: qty 15

## 2018-06-28 MED FILL — oxyCODONE HCL 5 MG/5ML SOLN: 5 | 4 days supply | Qty: 240 | Fill #0

## 2018-06-28 SURGICAL SUPPLY — 57 items
APPLIER CLIP 9.375 MED OPEN (MISCELLANEOUS)
APR CLP MED 9.3 20 MLT OPN (MISCELLANEOUS)
ATTRACTOMAT 16X20 MAGNETIC DRP (DRAPES) IMPLANT
BLADE SURG 15 STRL LF DISP TIS (BLADE) ×1 IMPLANT
BLADE SURG 15 STRL SS (BLADE)
CANISTER SUCT 3000ML PPV (MISCELLANEOUS) ×3 IMPLANT
CLEANER TIP ELECTROSURG 2X2 (MISCELLANEOUS) ×3 IMPLANT
CLIP APPLIE 9.375 MED OPEN (MISCELLANEOUS) ×1 IMPLANT
CONT SPEC 4OZ CLIKSEAL STRL BL (MISCELLANEOUS) ×6 IMPLANT
COVER BACK TABLE 60X90IN (DRAPES) IMPLANT
COVER SURGICAL LIGHT HANDLE (MISCELLANEOUS) ×3 IMPLANT
CRADLE DONUT ADULT HEAD (MISCELLANEOUS) ×2 IMPLANT
DRAIN CHANNEL 15F RND FF W/TCR (WOUND CARE) IMPLANT
DRAPE HALF SHEET 40X57 (DRAPES) ×3 IMPLANT
ELECT COATED BLADE 2.86 ST (ELECTRODE) ×3 IMPLANT
ELECT REM PT RETURN 9FT ADLT (ELECTROSURGICAL) ×3
ELECTRODE REM PT RTRN 9FT ADLT (ELECTROSURGICAL) ×1 IMPLANT
EVACUATOR SILICONE 100CC (DRAIN) IMPLANT
GAUZE 4X4 16PLY RFD (DISPOSABLE) ×3 IMPLANT
GAUZE SPONGE 4X4 12PLY STRL (GAUZE/BANDAGES/DRESSINGS) IMPLANT
GLOVE ECLIPSE 8.0 STRL XLNG CF (GLOVE) ×4 IMPLANT
GOWN STRL REUS W/ TWL LRG LVL3 (GOWN DISPOSABLE) ×2 IMPLANT
GOWN STRL REUS W/TWL LRG LVL3 (GOWN DISPOSABLE) ×6
KIT BASIN OR (CUSTOM PROCEDURE TRAY) ×3 IMPLANT
KIT TURNOVER KIT B (KITS) ×3 IMPLANT
LOCATOR NERVE 3 VOLT (DISPOSABLE) IMPLANT
NDL HYPO 25GX1X1/2 BEV (NEEDLE) IMPLANT
NEEDLE HYPO 25GX1X1/2 BEV (NEEDLE) IMPLANT
NS IRRIG 1000ML POUR BTL (IV SOLUTION) ×3 IMPLANT
PAD ARMBOARD 7.5X6 YLW CONV (MISCELLANEOUS) ×6 IMPLANT
PENCIL FOOT CONTROL (ELECTRODE) ×3 IMPLANT
SPECIMEN JAR MEDIUM (MISCELLANEOUS) ×1 IMPLANT
SPECIMEN JAR SMALL (MISCELLANEOUS) ×3 IMPLANT
SPONGE INTESTINAL PEANUT (DISPOSABLE) IMPLANT
SPONGE LAP 18X18 X RAY DECT (DISPOSABLE) ×1 IMPLANT
STAPLER VISISTAT 35W (STAPLE) ×3 IMPLANT
SURGILUBE 2OZ TUBE FLIPTOP (MISCELLANEOUS) IMPLANT
SUT CHROMIC 3 0 PS 2 (SUTURE) ×3 IMPLANT
SUT SILK 2 0 PERMA HAND 18 BK (SUTURE) ×3 IMPLANT
SUT SILK 2 0 SH (SUTURE) ×2 IMPLANT
SUT SILK 3 0 (SUTURE)
SUT SILK 3 0 SH CR/8 (SUTURE) IMPLANT
SUT SILK 3-0 18XBRD TIE 12 (SUTURE) IMPLANT
SUT SILK 4 0 (SUTURE)
SUT SILK 4-0 18XBRD TIE 12 (SUTURE) IMPLANT
SUT VIC AB 3-0 SH 27 (SUTURE) ×6
SUT VIC AB 3-0 SH 27X BRD (SUTURE) IMPLANT
SUT VIC AB 3-0 SH 27XBRD (SUTURE) ×1 IMPLANT
SUT VIC AB 4-0 RB1 18 (SUTURE) IMPLANT
TOOTHBRUSH ADULT (PERSONAL CARE ITEMS) ×3 IMPLANT
TOWEL OR 17X24 6PK STRL BLUE (TOWEL DISPOSABLE) IMPLANT
TRAY ENT MC OR (CUSTOM PROCEDURE TRAY) ×3 IMPLANT
TRAY FOLEY MTR SLVR 14FR STAT (SET/KITS/TRAYS/PACK) IMPLANT
TUBE CONNECTING 12'X1/4 (SUCTIONS)
TUBE CONNECTING 12X1/4 (SUCTIONS) ×1 IMPLANT
UNDERPAD 30X30 (UNDERPADS AND DIAPERS) ×1 IMPLANT
WATER STERILE IRR 1000ML POUR (IV SOLUTION) IMPLANT

## 2018-06-28 NOTE — Anesthesia Procedure Notes (Signed)
Procedure Name: Intubation Date/Time: 06/28/2018 11:58 AM Performed by: Inda Coke, CRNA Pre-anesthesia Checklist: Patient identified, Emergency Drugs available, Suction available and Patient being monitored Patient Re-evaluated:Patient Re-evaluated prior to induction Oxygen Delivery Method: Circle System Utilized Preoxygenation: Pre-oxygenation with 100% oxygen Induction Type: IV induction Ventilation: Mask ventilation without difficulty Laryngoscope Size: Mac and 4 Grade View: Grade I Nasal Tubes: Nasal Rae, Nasal prep performed and Magill forceps- large, utilized Tube size: 7.0 mm Number of attempts: 1 Placement Confirmation: ETT inserted through vocal cords under direct vision,  positive ETCO2 and breath sounds checked- equal and bilateral Secured at: 28 cm Tube secured with: Tape Dental Injury: Teeth and Oropharynx as per pre-operative assessment

## 2018-06-28 NOTE — Discharge Instructions (Signed)
Suck on popsicles, ice cubes, etc to help with tongue pain.   Hydrocodone alternating with Ibuprofen for pain relief. Liquid diet, advancing gradually to soft solids after 1-2 weeks I will call with the Pathology report Recheck my office 2 weeks.  [phone] for an appointment Peridex oral rinse, I tsp swish and spit 4 times daily Routine activities after 5 days OK to bathe/shower Get rid of your cigarettes please

## 2018-06-28 NOTE — Op Note (Signed)
06/28/2018  1:32 PM    Larry Glover  [id number]   Pre-Op Dx:  T1-2 N0M0 Squamous cell carcinoma of LEFT tongue  Post-op Dx:  same  Proc: Partial LEFT glossectomy   Surg:  Tyson Alias MD  Anes:  GNT  EBL:  50 ml  Comp:  none  Findings:  12 mm lateral oral tongue ulcer with some palpable thickness.  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  No lymph nodes identified.    Procedure: the patient in a comfortable supine position, general nasotracheal anesthesia was induced without difficulty. At an appropriate level, the patient was placed in a semisitting position. The oral cavity was inspected and the neck likewise with the findings as described above. A Betadine solution tooth brushing and prep of the mouth and face was performed.  A routine surgical timeout was obtained. Identifying initials were noted.  Tongue clip was used in the midline to retract the tongue. Lip retractors were in place. A bite block was used. A moist 4 x 4 as a throat pack was placed.  Working under direct vision, Bovie was used to mark out the area of resection with a generous margin. In the areas of leukoplakia,  Limited depth was felt indicated. In the area of the ulceration proper, deeper depth was planned.  The leukoplakia, mucosa, and tumor were resected en bloc. Identifying sutures were placed. Branches of the lingual artery were controlled with cautery and with silk ligature. Frozen section was taken from the dorsal tongue mucosal margin, and from the deep muscular margin both of which were negative.  Upon observing hemostasis, the wound was closed with interrupted 3-0 Vicryl mattress sutures. A good closure was accomplished. Hemostasis was again observed.  The oral cavity was thoroughly irrigated. The throat pack was removed.the bite block and the retractors were removed. The towel clip was removed from the tongue. Again hemostasis was observed.  At this point  the patient was returned to anesthesia, awakened, extubated, and transferred to recovery in stable condition.  Dispo:   PACU to home  Plan:  Ice, elevation, analgesia, peridex rinses.  Full liquid diet.  Recheck my office 2 weeks.  Tyson Alias MD

## 2018-06-28 NOTE — Transfer of Care (Signed)
Immediate Anesthesia Transfer of Care Note  Patient: Larry Glover  Procedure(s) Performed: WIDE EXCISION OF LEFT TONGUE CANCER (Left Mouth)  Patient Location: PACU  Anesthesia Type:General  Level of Consciousness: awake and alert   Airway & Oxygen Therapy: Patient Spontanous Breathing and Patient connected to nasal cannula oxygen  Post-op Assessment: Report given to RN, Post -op Vital signs reviewed and stable and Patient moving all extremities X 4  Post vital signs: Reviewed and stable  Last Vitals:  Vitals Value Taken Time  BP 123/89 06/28/2018  1:31 PM  Temp    Pulse 89 06/28/2018  1:36 PM  Resp 13 06/28/2018  1:36 PM  SpO2 99 % 06/28/2018  1:36 PM  Vitals shown include unvalidated device data.  Last Pain:  Vitals:   06/28/18 1003  TempSrc:   PainSc: 5       Patients Stated Pain Goal: 0 (88/75/79 7282)  Complications: No apparent anesthesia complications

## 2018-06-28 NOTE — Anesthesia Preprocedure Evaluation (Addendum)
Anesthesia Evaluation  Patient identified by MRN, date of birth, ID band Patient awake    Reviewed: Allergy & Precautions, NPO status , Patient's Chart, lab work & pertinent test results  Airway Mallampati: I  TM Distance: >3 FB Neck ROM: Full    Dental no notable dental hx.    Pulmonary Current Smoker,    Pulmonary exam normal breath sounds clear to auscultation       Cardiovascular negative cardio ROS Normal cardiovascular exam Rhythm:Regular Rate:Normal     Neuro/Psych negative neurological ROS  negative psych ROS   GI/Hepatic negative GI ROS, Neg liver ROS,   Endo/Other  negative endocrine ROS  Renal/GU negative Renal ROS     Musculoskeletal negative musculoskeletal ROS (+)   Abdominal   Peds  Hematology [facility] spotted fever   Anesthesia Other Findings left lateral tongue cancer  Reproductive/Obstetrics                            Anesthesia Physical Anesthesia Plan  ASA: II  Anesthesia Plan: General   Post-op Pain Management:    Induction: Intravenous  PONV Risk Score and Plan: 1 and Ondansetron, Dexamethasone, Midazolam and Treatment may vary due to age or medical condition  Airway Management Planned: Nasal ETT  Additional Equipment:   Intra-op Plan:   Post-operative Plan: Extubation in OR  Informed Consent: I have reviewed the patients History and Physical, chart, labs and discussed the procedure including the risks, benefits and alternatives for the proposed anesthesia with the patient or authorized representative who has indicated his/her understanding and acceptance.   Dental advisory given  Plan Discussed with: CRNA  Anesthesia Plan Comments:         Anesthesia Quick Evaluation

## 2018-06-28 NOTE — Interval H&P Note (Signed)
History and Physical Interval Note:  06/28/2018 11:31 AM  Larry Glover  has presented today for surgery, with the diagnosis of left lateral tongue cancer  The various methods of treatment have been discussed with the patient and family. After consideration of risks, benefits and other options for treatment, the patient has consented to  Procedure(s): WIDE EXCISION OF LEFT TONGUE CANCER (Left) as a surgical intervention .  The patient's history has been re-reviewed, patient re-examined, no change in status, stable for surgery.  I have re-reviewed the patient's chart and labs.  Questions were answered to the patient's satisfaction.     Jodi Marble

## 2018-06-30 NOTE — Anesthesia Postprocedure Evaluation (Signed)
Anesthesia Post Note  Patient: Larry Glover  Procedure(s) Performed: WIDE EXCISION OF LEFT TONGUE CANCER (Left Mouth)     Patient location during evaluation: PACU Anesthesia Type: General Level of consciousness: awake and alert Pain management: pain level controlled Vital Signs Assessment: post-procedure vital signs reviewed and stable Respiratory status: spontaneous breathing, nonlabored ventilation, respiratory function stable and patient connected to nasal cannula oxygen Cardiovascular status: blood pressure returned to baseline and stable Postop Assessment: no apparent nausea or vomiting Anesthetic complications: no    Last Vitals:  Vitals:   06/28/18 1505 06/28/18 1522  BP:  (!) 140/40  Pulse:  70  Resp:  14  Temp: (!) 36.3 C   SpO2:  98%    Last Pain:  Vitals:   06/28/18 1505  TempSrc:   PainSc: 5                  Chelsey L Woodrum

## 2018-07-01 ENCOUNTER — Encounter (HOSPITAL_COMMUNITY): Payer: Self-pay | Admitting: Otolaryngology

## 2018-07-02 ENCOUNTER — Other Ambulatory Visit: Payer: Self-pay | Admitting: Otolaryngology

## 2018-07-02 MED FILL — oxyCODONE HCL 5 MG/5ML SOLN: 5 | 6 days supply | Qty: 400 | Fill #0

## 2018-07-12 ENCOUNTER — Telehealth: Payer: Self-pay | Admitting: *Deleted

## 2018-07-12 DIAGNOSIS — C760 Malignant neoplasm of head, face and neck: Secondary | ICD-10-CM

## 2018-07-12 NOTE — Telephone Encounter (Signed)
Oncology Nurse Navigator Documentation  Placed introductory call to new referral patient Mr. Alferd Apa.  Introduced myself as the H&N oncology nurse navigator that works with Dr. Isidore Moos to whom he has been referred by Dr. Erik Obey.  He confirmed understanding of referral.  He voiced understanding order is being placed for CT Chest at [facility], appt with Dr. Isidore Moos to be scheduled next available s/p scan.  I briefly explained my role as his navigator, indicated I would be joining him during his appt with Dr. Isidore Moos.  I confirmed his understanding of [place] location, explained arrival and RadOnc registration process.  He denied dental issues though he does not see a dentist.  I explained the purpose of a dental evaluation prior to starting RT, indicated he wd be contacted by [place] to arrange an appt within a day or so of his appt with Dr. Isidore Moos.   I provided my contact information, encouraged him to call with questions/concerns as he moves forward with appts.   He verbalized understanding of information provided, expressed appreciation for my call.  Gayleen Orem, RN, BSN Head & Neck Oncology Nurse [place] at [place] [phone]

## 2018-07-15 MED FILL — HYDROmorphone HCL 2 MG TABS: 2 | 5 days supply | Qty: 30 | Fill #0

## 2018-07-18 ENCOUNTER — Telehealth: Payer: Self-pay | Admitting: *Deleted

## 2018-07-18 ENCOUNTER — Encounter: Payer: Self-pay | Admitting: Radiation Oncology

## 2018-07-18 NOTE — Telephone Encounter (Signed)
Oncology Nurse Navigator Documentation  Spoke with Mr Sidle fiance (his phone is not working), informed her of:  9/18 CT Chest with 0745 arrival to [facility], liquids only 4 hrs prior  9/20 consult Dr. Isidore Moos with [place] arrival to [facility] for [place] NE/0800 Dr. Isidore Moos. She voiced understanding, indicated she would relay information.  Gayleen Orem, RN, BSN Head & Neck Oncology Nurse [place] at [place] [phone]

## 2018-07-22 NOTE — Progress Notes (Signed)
Head and Neck Cancer Location of Tumor / Histology:  06/28/18 Diagnosis 1. Tongue, biopsy, Dorsal Mucosa - REACTIVE SQUAMOUS MUCOSA - NO CARCINOMA IDENTIFIED 2. Tongue, biopsy, Deep Muscle Margin - BENIGN MUSCLE - NO CARCINOMA IDENTIFIED 3. Tongue, resection for tumor, Left Partial - INVASIVE SQUAMOUS CELL CARCINOMA, MODERATELY DIFFERENTIATED (0.7 CM) - PERINEURAL INVASION PRESENT - MARGINS UNINVOLVED BY CARCINOMA - SEE ONCOLOGY TABLE AND COMMENT BELOW  07/02/18 Diagnosis Consult Slide , Tongue Ulceration - SQUAMOUS CELL CARCINOMA, INVASIVE  Patient presented to the ED on 06/02/18 with symptoms of:  He states that he noticed this sometime in January earlier this year.  He states he noticed a lesion to the left lateral edge of the tongue which he described as "almost like a pimple ".  He states that since then the lesion has grown into a shallow ulceration and is quite tender.  He notes a sharp stinging pain to this lesion which worsens with exposure to acidic foods, spicy foods, and when he smokes.    Biopsies of tongue revealed: Invasive squamus cell carcinoma, moderately differentiated. Perineural invasion is present.   Nutrition Status Yes No Comments  Weight changes? [x]  []  He reports losing 35 lbs since January.   Swallowing concerns? [x]  []  He is swallowing softer foods, due to surgery.   PEG? []  [x]     Referrals Yes No Comments  Social Work? []  [x]    Dentistry? []  [x]    Swallowing therapy? []  [x]    Nutrition? []  [x]    Med/Onc? []  [x]     Safety Issues Yes No Comments  Prior radiation? []  [x]    Pacemaker/ICD? []  [x]    Possible current pregnancy? []  [x]    Is the patient on methotrexate? []  [x]     Tobacco/Marijuana/Snuff/ETOH use: He drinks alcohol socially. He is a current smoker.   Past/Anticipated interventions by otolaryngology, if any:  06/28/18 Proc: Partial LEFT glossectomy  Surg:  Jodi Marble T MD  Past/Anticipated interventions by medical oncology, if any:   None scheduled.   Current Complaints / other details:   07/24/18 CT Chest  BP 106/79 (BP Location: Right Arm, Patient Position: Sitting)   Pulse 77   Temp 98.7 F (37.1 C) (Oral)   Resp 20   Ht 5\' 11"  (1.803 m)   Wt 176 lb 12.8 oz (80.2 kg)   SpO2 100%   BMI 24.66 kg/m    Wt Readings from Last 3 Encounters:  07/26/18 176 lb 12.8 oz (80.2 kg)  06/28/18 175 lb (79.4 kg)  03/15/18 183 lb (83 kg)

## 2018-07-24 ENCOUNTER — Ambulatory Visit (HOSPITAL_COMMUNITY)
Admission: RE | Admit: 2018-07-24 | Discharge: 2018-07-24 | Disposition: A | Discharge status: Home / Self Care | Payer: Medicaid Other | Source: Ambulatory Visit | Attending: Radiation Oncology | Admitting: Radiation Oncology

## 2018-07-24 ENCOUNTER — Encounter (HOSPITAL_COMMUNITY): Payer: Self-pay

## 2018-07-24 DIAGNOSIS — C760 Malignant neoplasm of head, face and neck: Secondary | ICD-10-CM | POA: Diagnosis present

## 2018-07-24 MED ORDER — IOHEXOL 300 MG/ML  SOLN
75.0000 mL | Freq: Once | INTRAMUSCULAR | Status: AC | PRN
  Administered 2018-07-24: 75 mL via INTRAVENOUS

## 2018-07-25 ENCOUNTER — Telehealth: Payer: Self-pay | Admitting: *Deleted

## 2018-07-25 NOTE — Telephone Encounter (Signed)
Oncology Nurse Navigator Documentation  Called Mr. Larry Glover to confirm his arrival tomorrow morning for 0730 RadOnc appt.  I encouraged him to arrive by 0715, explained registration process and arrival to Radiation Waiting.  He confirmed also understanding of 0900 appt with Dental Medicine. Informed him of unremarkable results of yesterday's CT Chest.  He expressed relief. Answered his questions re timeline for starting XRT. He voiced appreciation for call.  Gayleen Orem, RN, BSN Head & Neck Oncology Nurse [place] at [place] [phone]

## 2018-07-26 ENCOUNTER — Other Ambulatory Visit: Payer: Self-pay

## 2018-07-26 ENCOUNTER — Encounter: Payer: Self-pay | Admitting: Radiation Oncology

## 2018-07-26 ENCOUNTER — Ambulatory Visit
Admission: RE | Admit: 2018-07-26 | Discharge: 2018-07-26 | Disposition: A | Discharge status: Home / Self Care | Payer: Medicaid Other | Source: Ambulatory Visit | Attending: Radiation Oncology | Admitting: Radiation Oncology

## 2018-07-26 ENCOUNTER — Encounter: Payer: Self-pay | Admitting: *Deleted

## 2018-07-26 ENCOUNTER — Other Ambulatory Visit: Payer: Self-pay | Admitting: Radiation Oncology

## 2018-07-26 ENCOUNTER — Encounter (HOSPITAL_COMMUNITY): Payer: Self-pay | Admitting: Dentistry

## 2018-07-26 ENCOUNTER — Ambulatory Visit (HOSPITAL_COMMUNITY): Payer: Self-pay | Admitting: Dentistry

## 2018-07-26 VITALS — BP 122/76 | HR 63 | Temp 98.4°F

## 2018-07-26 DIAGNOSIS — K0889 Other specified disorders of teeth and supporting structures: Secondary | ICD-10-CM

## 2018-07-26 DIAGNOSIS — K036 Deposits [accretions] on teeth: Secondary | ICD-10-CM

## 2018-07-26 DIAGNOSIS — K029 Dental caries, unspecified: Secondary | ICD-10-CM

## 2018-07-26 DIAGNOSIS — C021 Malignant neoplasm of border of tongue: Secondary | ICD-10-CM | POA: Diagnosis not present

## 2018-07-26 DIAGNOSIS — Z885 Allergy status to narcotic agent status: Secondary | ICD-10-CM | POA: Diagnosis not present

## 2018-07-26 DIAGNOSIS — Z87891 Personal history of nicotine dependence: Secondary | ICD-10-CM | POA: Diagnosis not present

## 2018-07-26 DIAGNOSIS — C029 Malignant neoplasm of tongue, unspecified: Secondary | ICD-10-CM

## 2018-07-26 DIAGNOSIS — K045 Chronic apical periodontitis: Secondary | ICD-10-CM

## 2018-07-26 DIAGNOSIS — K083 Retained dental root: Secondary | ICD-10-CM

## 2018-07-26 DIAGNOSIS — K0510 Chronic gingivitis, plaque induced: Secondary | ICD-10-CM

## 2018-07-26 DIAGNOSIS — K0530 Chronic periodontitis, unspecified: Secondary | ICD-10-CM

## 2018-07-26 DIAGNOSIS — K08409 Partial loss of teeth, unspecified cause, unspecified class: Secondary | ICD-10-CM

## 2018-07-26 DIAGNOSIS — M264 Malocclusion, unspecified: Secondary | ICD-10-CM

## 2018-07-26 DIAGNOSIS — K06010 Localized gingival recession, unspecified: Secondary | ICD-10-CM

## 2018-07-26 DIAGNOSIS — Z01818 Encounter for other preprocedural examination: Secondary | ICD-10-CM

## 2018-07-26 NOTE — Progress Notes (Signed)
DENTAL CONSULTATION  Date of Consultation:  07/26/2018 Patient Name:   Larry Glover Date of Birth:   06/28/1986 Medical Record Number: [id number]  VITALS: BP 122/76 (BP Location: Right Arm)   Pulse 63   Temp 98.4 F (36.9 C)   CHIEF COMPLAINT: The patient was referred by Dr. Isidore Moos for a dental consultation.  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 Patient is status post left partial glossectomy with Dr. Erik Obey on 2/77/82. Patient with anticipated postoperative radiation therapy with Dr. Isidore Moos.  The patient is now seen as part of a medically necessary preradiation therapy dental protocol examination.  The patient currently denies acute toothaches.  Patient does have some persistent postoperative discomfort in the lower left quadrant and tongue area.  The patient was last seen by a dentist 2-3 years ago in [place], [place]. Patient had a dental cleaning and dental extraction at that time. There were no complications with that dental extraction by patient report. Patient denies having dental phobia. Patient denies having partial dentures.  PROBLEM LIST: Patient Active Problem List   Diagnosis Date Noted  . Primary tongue squamous cell carcinoma ([place]) 06/26/2018    PMH: Past Medical History:  Diagnosis Date  . [facility] spotted fever   . Squamous cell carcinoma of lateral tongue ([place]) 06/28/2018   stage 1 tongue cancer    PSH: Past Surgical History:  Procedure Laterality Date  . ADENOIDECTOMY    . BACK SURGERY     32 years old  . EXCISION OF TONGUE LESION Left 06/28/2018   Procedure: WIDE EXCISION OF LEFT TONGUE CANCER;  Surgeon: Jodi Marble, MD;  Location: [facility];  Service: ENT;  Laterality: Left;  . SUPERFICIAL LYMPH NODE BIOPSY / EXCISION     in leg    ALLERGIES: Allergies  Allergen Reactions  . Hydrocodone     hiccups    MEDICATIONS: Current Outpatient Medications   Medication Sig Dispense Refill  . chlorhexidine (PERIDEX) 0.12 % solution   0   No current facility-administered medications for this visit.      LABS: Lab Results  Component Value Date   WBC 5.5 06/28/2018   HGB 15.7 06/28/2018   HCT 43.0 06/28/2018   MCV 93.5 06/28/2018   PLT 578 (H) 06/28/2018      Component Value Date/Time   NA 140 06/28/2018 0954   K 4.3 06/28/2018 0954   CL 104 06/28/2018 0954   CO2 26 06/28/2018 0954   GLUCOSE 100 (H) 06/28/2018 0954   BUN 17 06/28/2018 0954   CREATININE 1.17 06/28/2018 0954   CALCIUM 9.3 06/28/2018 0954   GFRNONAA >60 06/28/2018 0954   GFRAA >60 06/28/2018 0954   No results found for: INR, PROTIME No results found for: PTT  SOCIAL HISTORY: Social History   Socioeconomic History  . Marital status: Married    Spouse name: Not on file  . Number of children: Not on file  . Years of education: Not on file  . Highest education level: Not on file  Occupational History  . Not on file  Social Needs  . Financial resource strain: Not on file  . Food insecurity:    Worry: Not on file    Inability: Not on file  . Transportation needs:    Medical: Not on file    Non-medical: Not on file  Tobacco Use  . Smoking status: Current Every Day Smoker    Packs/day: 0.50  Types: Cigarettes  . Smokeless tobacco: Never Used  . Tobacco comment: he had quit for a short period, and had a couple cigarettes yesterday.   Substance and Sexual Activity  . Alcohol use: Yes    Comment: social  . Drug use: No  . Sexual activity: Not on file  Lifestyle  . Physical activity:    Days per week: Not on file    Minutes per session: Not on file  . Stress: Not on file  Relationships  . Social connections:    Talks on phone: Not on file    Gets together: Not on file    Attends religious service: Not on file    Active member of club or organization: Not on file    Attends meetings of clubs or organizations: Not on file    Relationship status:  Not on file  . Intimate partner violence:    Fear of current or ex partner: No    Emotionally abused: No    Physically abused: No    Forced sexual activity: No  Other Topics Concern  . Not on file  Social History Narrative  . Not on file    FAMILY HISTORY: Family History  Problem Relation Age of Onset  . Cancer Father     REVIEW OF SYSTEMS: Reviewed with the patient as per History of present illness. Psych: Patient denies having dental phobia.  DENTAL HISTORY: CHIEF COMPLAINT: The patient was referred by Dr. Isidore Moos for a dental consultation.  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 Patient is status post left partial glossectomy with Dr. Erik Obey on 05/06/15. Patient with anticipated postoperative radiation therapy with Dr. Isidore Moos.  The patient is now seen as part of a medically necessary preradiation therapy dental protocol examination.  The patient currently denies acute toothaches.  Patient does have some persistent postoperative discomfort in the lower left quadrant and tongue area.  The patient was last seen by a dentist 2-3 years ago in [place], [place]. Patient had a dental cleaning and dental extraction at that time. There were no complications with that dental extraction by patient report. Patient denies having dental phobia. Patient denies having partial dentures.  DENTAL EXAMINATION: GENERAL:  The patient is a well-developed, well-nourished male in no acute distress. HEAD AND NECK:  There is no palpable neck lymphadenopathy.  Patient denies having acute TMJ symptoms. Maximum interincisal opening is measured at 47 mm. INTRAORAL EXAM:  Patient has normal saliva.  The left lateral tongue is consistent with previous surgery by Dr. Erik Obey. DENTITION: The patient is missing tooth number 3.  There are retained root segments in the area tooth numbers 1 and 16.   PERIODONTAL: The patient has  chronic periodontitis with plaque and calculus accumulations, gingival recession, and incipient mandibular anterior tooth mobility. There is incipient to moderate bone loss noted. Radiographic calculus is noted. DENTAL CARIES/SUBOPTIMAL RESTORATIONS: Dental caries are noted as per dental charting form. ENDODONTIC:  Patient denies acute pulpitis symptoms.  There are periapical radiolucencies associated with tooth numbers 1 and 16. CROWN AND BRIDGE: There are no crown or bridge restorations noted. PROSTHODONTIC: There are no partial dentures. Orthodontics: The patient has a history of orthodontic braces from age 74 through 5.  OCCLUSION: The patient has a stable occlusion.  RADIOGRAPHIC INTERPRETATION: An orthopantogram was taken and supplemented with 12 periapical radiographs and 4 bitewings. This is suboptimal secondary to lack of patient cooperation/patient movement.  There  is missing tooth #3. There are retained root segments in the area tooth numbers 1 and 16. There is a periapical radiolucency associated with the apices of tooth numbers 1 and 16. There is incipient to moderate bone loss noted. Radiographic calculus is noted. Dental caries are noted.   ASSESSMENTS: 1. 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 2. Preradiation therapy dental protocol 3. Retained root segments 4. Chronic apical periodontitis 5. Dental caries 6. Chronic periodontitis with bone loss 7. Accretions 8. Gingival recession 9. Incipient mandibular anterior tooth mobility 10. Missing tooth #3. 11. History of orthodontic therapy from ages [age] 12. Stable occlusion   PLAN/RECOMMENDATIONS: 1. I discussed the risks, benefits, and complications of various treatment options with the patient in relationship to his medical and dental conditions, anticipated radiation therapy, and radiation therapy side effects to include xerostomia, radiation caries, trismus, mucositis, taste changes, gum and jawbone  changes, and risk for infection and osteoradionecrosis.. We discussed various treatment options to include no treatment, multiple extractions with alveoloplasty, pre-prosthetic surgery as indicated, periodontal therapy, dental restorations, root canal therapy, crown and bridge therapy, implant therapy, and replacement of missing teeth as indicated. We also discussed fabrication of fluoride trays and radiation cone locator/tongue positioner.The patient currently wishes to proceed with the obtaining of impressions for future fabrication of fluoride trays and possible tongue positioner as needed. The patient is currently contemplating a second opinion concerning the need for radiation therapy and he will discuss neck dissection treatment option further with Dr. Erik Obey.  Patient was instructed to contact the head and neck navigator with assistance in obtaining second opinion and follow-up with Dr. Erik Obey as indicated. Patient is aware that upon his further decision to procced with the radiation therapy treatment, he will require referral to an oral surgeon for second opinion concerning extraction of tooth numbers 1, 16, 17, 32 and possible additional extraction of teeth in the primary field radiation therapy to include tooth numbers 18, 19, 20, 21. Patient also ideally will need additional periodontal therapy for gross debridement of remaining dentition. A prescription for fluoride therapy will be provided as indicated.   2. Discussion of findings with medical team and coordination of future medical and dental care as needed.  I spent in excess of  120 minutes during the conduct of this consultation and >50% of this time involved direct face-to-face encounter for counseling and/or coordination of the patient's care.    Lenn Cal, DDS

## 2018-07-26 NOTE — Progress Notes (Signed)
Radiation Oncology         [phone] ________________________________  Initial Outpatient Consultation  Name: Larry Glover MRN: [id number]  Date: 07/26/2018  DOB: July 03, 1986  WU:[id number], No Pcp Per  Jodi Marble, MD   REFERRING PHYSICIAN: Jodi Marble, MD  DIAGNOSIS:    ICD-10-CM   1. Squamous cell carcinoma of lateral tongue (HCC) C02.1    Cancer Staging Squamous cell carcinoma of lateral tongue (HCC) Staging form: Oral Cavity, AJCC 8th Edition - Pathologic stage from 07/26/2018: Stage Unknown (pT1, pNX, cM0) - Signed by Eppie Gibson, MD on 07/29/2018 - Clinical: Stage I (cT1, cN0, cM0) - Signed by Eppie Gibson, MD on 07/29/2018   CHIEF COMPLAINT: Here to discuss management of tongue cancer  HISTORY OF PRESENT ILLNESS::Larry Glover is a 32 y.o. male who presented with a possible lesion to his left lateral tongue that resembled a pimple in January 2019. He was evaluated in the Emergency Department on 06/02/2018 due to the lesion to his left tongue and was advised to follow up with ENT Specialist. He notes that since he has had a shallow ulceration with pain to the area. He notices a sharp, stinging pain with exposure to acidic/spicy foods and when he smokes.   Subsequently, the patient saw Dr. Erik Obey who performed a tongue biopsy on the patient and referred the patient to the office for further evaluation.  Resection  of tongue on 06/28/2018 revealed: Tongue, biopsy, Dorsal Mucosa with reactive squamous mucosa. No carcinoma identified. Tongue, biopsy, Deep Muscle Margin with benign muscle. No carcinoma identified. Tongue, resection for tumor, Left Partial with invasive squamous cell carcinoma, moderately differentiated (0.7 CM). Perineural invasion present. Margins uninvolved by carcinoma.   On the consult slide on 07/02/2018 from the pathology on 06/28/2018 it revealed: Consult Slide , Tongue Ulceration with squamous cell carcinoma, invasive. p16 immunohistochemistry with  weak cytoplasmic staining (overall negative).   Pertinent imaging thus far includes CT Soft tissue neck w contrast performed on 06/19/2018 revealing Small hyperenhancing area of the left lateral oral tongue measuring 7 x 10 mm best demonstrated on coronal image 34. However, it is possible that some of this finding is artifact due to streak from adjacent dentition. No cervical lymphadenopathy and otherwise negative CT appearance of the neck.   CT Chest w contrast on 07/24/2018 showed: No evidence for metastatic disease in the thorax. Otherwise unremarkable exam.   He denies a prior hx of DM or MI. He notes that if at all possible, he would like to return to work and voices when he would be able to do so.   I personally reviewed the patient's imaging prior to today's visit.           Swallowing issues, if any: Yes, he is swallowing softer foods due to surgery  Weight Changes: Yes, intentional weight loss of 35 lb lost since January due to exercise  Pain status: Mild, however, it is tolerable  Other symptoms: He has a "spider-web" like sensation behind his top left molar. He has headaches when he takes prescription dilaudid following his surgery. He hasn't had any dilaudid for 6 days and his pain has been tolerated well. He denies abdominal pain, numbness, weakness, pain with urination/bowel movements, swelling in extremities, CP, SOB, rash, and any other symptoms.   Tobacco history, if any: He has smoked 1 PPD since age 47. He notes that that he has "quit" following surgery, however, he smoked 3 cigarettes yesterday. He notes that following his diagnosis, he had  a mind shift and noted that he "wanted to live." He does use marijuana occasionally.   ETOH abuse, if any: He drinks alcohol socially  Prior cancers, if any: No  PREVIOUS RADIATION THERAPY: No  PAST MEDICAL HISTORY:  has a past medical history of [facility] spotted fever and Squamous cell carcinoma of lateral tongue ([place])  (06/28/2018).    PAST SURGICAL HISTORY: Past Surgical History:  Procedure Laterality Date  . ADENOIDECTOMY    . BACK SURGERY     32 years old  . EXCISION OF TONGUE LESION Left 06/28/2018   Procedure: WIDE EXCISION OF LEFT TONGUE CANCER;  Surgeon: Jodi Marble, MD;  Location: [facility];  Service: ENT;  Laterality: Left;  . SUPERFICIAL LYMPH NODE BIOPSY / EXCISION     in leg    FAMILY HISTORY: family history includes Cancer in his father.  SOCIAL HISTORY:  reports that he quit smoking about 4 weeks ago. His smoking use included cigarettes. He has a 20.00 pack-year smoking history. He has never used smokeless tobacco. He reports that he drinks alcohol. He reports that he does not use drugs.  ALLERGIES: Hydrocodone  MEDICATIONS:  Current Outpatient Medications  Medication Sig Dispense Refill  . chlorhexidine (PERIDEX) 0.12 % solution   0   No current facility-administered medications for this encounter.     REVIEW OF SYSTEMS: A 10+ POINT REVIEW OF SYSTEMS WAS OBTAINED including neurology, dermatology, psychiatry, cardiac, respiratory, lymph, extremities, GI, GU, Musculoskeletal, constitutional, breasts, reproductive, HEENT.  All pertinent positives are noted in the HPI.  All others are negative.   PHYSICAL EXAM:  height is 5\' 11"  (1.803 m) and weight is 176 lb 12.8 oz (80.2 kg). His oral temperature is 98.7 F (37.1 C). His blood pressure is 106/79 and his pulse is 77. His respiration is 20 and oxygen saturation is 100%.   General: Alert and oriented, in no acute distress HEENT: Head is normocephalic. Extraocular movements are intact. Oropharynx is notable for clear. Left tonsil is slightly fuller than the right. Surgical bed over the mid to posterior left lateral/ventral tongue is healing well. Neck: Neck is notable for no palpable masses in his neck.  Heart: Regular in rate and rhythm with no murmurs, rubs, or gallops. Chest: Clear to auscultation bilaterally, with no rhonchi, wheezes,  or rales. Abdomen: Soft, nontender, nondistended, with no rigidity or guarding. Extremities: No cyanosis or edema to extremities.  Lymphatics: see Neck Exam Skin: No concerning lesions. Musculoskeletal: symmetric strength and muscle tone throughout. Neurologic: Cranial nerves II through XII are grossly intact. No obvious focalities. Speech is fluent. Coordination is intact. Psychiatric: Judgment and insight are intact. Affect is appropriate.   ECOG = 0  0 - Asymptomatic (Fully active, able to carry on all predisease activities without restriction)  1 - Symptomatic but completely ambulatory (Restricted in physically strenuous activity but ambulatory and able to carry out work of a light or sedentary nature. For example, light housework, office work)  2 - Symptomatic, <50% in bed during the day (Ambulatory and capable of all self care but unable to carry out any work activities. Up and about more than 50% of waking hours)  3 - Symptomatic, >50% in bed, but not bedbound (Capable of only limited self-care, confined to bed or chair 50% or more of waking hours)  4 - Bedbound (Completely disabled. Cannot carry on any self-care. Totally confined to bed or chair)  5 - Death   Eustace Pen MM, Creech RH, Tormey DC, et al. [phone]). "  Toxicity and response criteria of the [facility]". [place] Oncol. 5 (6): 649-55   LABORATORY DATA:  Lab Results  Component Value Date   WBC 5.5 06/28/2018   HGB 15.7 06/28/2018   HCT 43.0 06/28/2018   MCV 93.5 06/28/2018   PLT 578 (H) 06/28/2018   CMP     Component Value Date/Time   NA 140 06/28/2018 0954   K 4.3 06/28/2018 0954   CL 104 06/28/2018 0954   CO2 26 06/28/2018 0954   GLUCOSE 100 (H) 06/28/2018 0954   BUN 17 06/28/2018 0954   CREATININE 1.17 06/28/2018 0954   CALCIUM 9.3 06/28/2018 0954   PROT 6.7 03/22/2009 2032   ALBUMIN 4.2 03/22/2009 2032   AST 27 03/22/2009 2032   ALT 27 03/22/2009 2032   ALKPHOS 60 03/22/2009  2032   BILITOT 0.4 03/22/2009 2032   GFRNONAA >60 06/28/2018 0954   GFRAA >60 06/28/2018 0954      No results found for: TSH   RADIOGRAPHY: Ct Chest W Contrast  Result Date: 07/24/2018 CLINICAL DATA:  Squamous cell tongue cancer. EXAM: CT CHEST WITH CONTRAST TECHNIQUE: Multidetector CT imaging of the chest was performed during intravenous contrast administration. CONTRAST:  10mL OMNIPAQUE IOHEXOL 300 MG/ML  SOLN COMPARISON:  10/01/2006. FINDINGS: Cardiovascular: The heart size is normal. Trace inferior pericardial effusion. Mediastinum/Nodes: No mediastinal lymphadenopathy. There is no hilar lymphadenopathy. The esophagus has normal imaging features. There is no axillary lymphadenopathy. Lungs/Pleura: The central tracheobronchial airways are patent. Lungs are clear without pulmonary nodule or mass. Upper Abdomen: Unremarkable. Musculoskeletal: No worrisome lytic or sclerotic osseous abnormality. IMPRESSION: 1. No evidence for metastatic disease in the thorax. 2. Otherwise unremarkable exam. Electronically Signed   By: Misty Stanley M.D.   On: 07/24/2018 10:07   Photos from records: 06-02-18         IMPRESSION/PLAN:  This is a delightful patient with head and neck cancer. The patient has a pathologic T1 tongue cancer and his neck is clinically negative. Neck dissection was not performed. Chest CT is negative for metastatic disease. The pathology does demonstrate the adverse feature of perineural invasion. According to the NCCN guidelines, this is an indication for adjuvant radiotherapy. I explained to the patient that tongue cancer can behave quite aggressively and relapse despite negative margins. I recommend adjuvant radiation to the tumor bed and prophylactic treatment to the lymph nodes in his neck. His young age gives me further reason to believe that the benefits of radiotherapy would outweigh the risks.   We discussed the potential risks, benefits, and side effects of a 6 wk course of  radiotherapy. We talked in detail about acute and late effects. We discussed that some of the most bothersome acute effects may be mucositis, dysgeusia, salivary changes, skin irritation, hair loss, dehydration, weight loss and fatigue. We talked about late effects which include but are not necessarily limited to dysphagia, hypothyroidism, nerve injury, spinal cord injury, xerostomia, trismus, and neck edema. No guarantees of treatment were given. A consent form was signed and placed in the patient's medical record. The patient is enthusiastic about proceeding with treatment. I look forward to participating in the patient's care.    Simulation (treatment planning) will take place once he is released by Dr. Enrique Sack.   We also discussed that the treatment of head and neck cancer is a multidisciplinary process to maximize treatment outcomes and quality of life. For this reason the following referrals have been or will be made:  Dentistry for dental evaluation, possible extractions in the radiation fields, and /or advice on reducing risk of cavities, osteoradionecrosis, or other oral issues.  Nutritionist for nutrition support during and after treatment.  Speech language pathology for swallowing and/or speech therapy.  Social work for social support.   Physical therapy due to risk of lymphedema in neck and deconditioning.  Baseline labs including TSH.  Risk Factors: The patient has been educated about risk factors including alcohol and tobacco abuse; they understand that avoidance of tobacco and alcohol is important to prevent recurrences as well as other cancers.   In-depth conversation with the patient regarding potential surgery prior to radiation treatment. Patient would like to pursue radiation therapy treatment to tongue and bilateral cervical lymph nodes rather than surgical node dissection if possible.   I asked the patient today about tobacco use. The patient uses tobacco.  I advised  the patient to quit. Services were offered by me today including outpatient counseling and pharmacotherapy. I assessed for the willingness to attempt to quit and provided encouragement and demonstrated willingness to make referrals and/or prescriptions to help the patient attempt to quit. The patient has follow-up with the oncologic team to touch base on their tobacco use and /or cessation efforts.  Over 3 minutes were spent on this issue. Gayleen Orem, RN, our Head and Neck Oncology Navigator will give the patient the 1-800-QUIT-NOW hotline for the patient to call and use PRN. Patient states he will quit today, declines Rxs to aid in this.  Patient also advised to stop using ETOH. Advised not to smoke any substance. Any ETOH, or smoking, can increase risk of cancer relapse.  Patient was given an informational document that our care team curated for managing acute radiation side effects for head and neck cancer patients to aid with with symptom management, and overall wellness during treatment. Patient also advised to utilize the baking soda rinses during radiation treatments. Phone number of Gayleen Orem, RN, our Head and Neck Oncology Navigator was provided in case any questions arise.    Note: after consultation, Dr Enrique Sack saw the patient and states he is interested in a second opinion re: radiation therapy. Gayleen Orem, RN, our Head and Neck Oncology Navigator will contact the patient to navigate accordingly.  __________________________________________   Eppie Gibson, MD   This document serves as a record of services personally performed by Eppie Gibson, MD. It was created on her behalf by [facility], a trained medical scribe. The creation of this record is based on the scribe's personal observations and the provider's statements to them. This document has been checked and approved by the attending provider.

## 2018-07-26 NOTE — Patient Instructions (Signed)

## 2018-07-29 ENCOUNTER — Other Ambulatory Visit: Payer: Self-pay | Admitting: Radiation Oncology

## 2018-07-29 ENCOUNTER — Encounter: Payer: Self-pay | Admitting: Radiation Oncology

## 2018-07-29 ENCOUNTER — Telehealth: Payer: Self-pay | Admitting: *Deleted

## 2018-07-29 DIAGNOSIS — R634 Abnormal weight loss: Secondary | ICD-10-CM

## 2018-07-29 DIAGNOSIS — C021 Malignant neoplasm of border of tongue: Secondary | ICD-10-CM

## 2018-07-29 NOTE — Telephone Encounter (Signed)
Oncology Nurse Navigator Documentation  Rec'd follow-up call from Larry Glover, [facility] new patient coordinator for Radiation Oncology, indicating she spoke with Mr. Larry Glover.  His request is for 2nd opinion with "general oncologist" which is being coordinated by Verita Lamb new patient navigator for hemeonc.  I called Dr. Noreene Filbert office, updated MA Amy with information.  Gayleen Orem, RN, BSN Head & Neck Oncology Nurse [place] [phone]

## 2018-07-29 NOTE — Telephone Encounter (Signed)
Oncology Nurse Navigator Documentation  Per patient's request for RT 2nd opinion and Dr. Pearlie Oyster guidance, called [facility] [phone]), spoke with new patient nurse coordinator Pernell Dupre, requested next available appt with either Dr. Tobey Bride or Dr. Leeanne Mannan.  Provided introductory information re patient's diagnosis, surgery, pre-radiotherapy dental evaluation.  Per her request will have imaging pushed and will fax path report.  Gayleen Orem, RN, BSN Head & Neck Oncology Nurse [place] at [place] [phone]

## 2018-07-29 NOTE — Telephone Encounter (Signed)
Oncology Nurse Navigator Documentation  Unable to reach Mr. Larry Glover, spoke with fiance.  Confirmed my understanding he is being referred to [facility] general oncology for second opinion.  Asked her to inform Mr. Larry Glover I am cancelling his attendance at tomorrow morning's [facility] [place] pending his treatment decisions.  She voiced understanding.  Gayleen Orem, RN, BSN Head & Neck Oncology Nurse [place] at [place] [phone]

## 2018-07-30 ENCOUNTER — Ambulatory Visit: Payer: Self-pay | Admitting: Physical Therapy

## 2018-07-30 ENCOUNTER — Ambulatory Visit: Payer: Self-pay

## 2018-07-30 ENCOUNTER — Inpatient Hospital Stay: Payer: Self-pay | Admitting: Nutrition

## 2018-07-31 ENCOUNTER — Telehealth: Payer: Self-pay | Admitting: *Deleted

## 2018-07-31 NOTE — Telephone Encounter (Addendum)
Oncology Nurse Navigator Documentation  Called Mr. Larry Glover for update on referral for 2nd opinion.  He stated he has an appt with Dr. Frazier Butt, [facility], next Wednesday.  I supported his interest in 2nd opinion, encouraged him to move forward with tmt ASAP irrespective of location.  He agreed to call me with update on treatment decision/location following this appt.    Drs. Erasmo Downer and [facility] informed.  Gayleen Orem, RN, BSN Head & Neck Oncology Nurse [place] at [place] [phone]

## 2018-08-01 NOTE — Progress Notes (Signed)
Oncology Nurse Navigator Documentation  Met with patient during initial consult with Dr. Isidore Moos.  He was unaccompanied.   . Further introduced myself as his Navigator, explained my role as a member of the Care Team.   . Provided New Patient Information packet, discussed contents: o Contact information for physician(s), myself, other members of the Care Team. o Advance Directive information ([place] blue pamphlet with LCSW contact info) o Fall Prevention Patient Safety Plan o Appointment Guideline o [place] [facility] map with highlight of [place] o SLP information sheet o Symptom Management Clinic information . Provided introductory explanation of radiation treatment including SIM planning and purpose of Aquaplast head and shoulder mask, showed him example.   . Of note: . Has smoked 1 ppd since 32 yrs old, has quit completely since dx until yesterday when he had 3 cig d/t several stressors. Museum/gallery curator. Marland Kitchen He stated ENT Dr. Erik Obey suggested L neck dissection to better diagnose extent of disease. Marland Kitchen He voiced understanding of proposed 6 wks XRT, stated not inclined to move forward with surgery. . Escorted him to [place] for appt with Dr. Tommie Raymond. . I encouraged him to contact me with questions/concerns as treatments/procedures begin.   Navigator Interventions Provided information/discussed opportunities for smoking cessation support:    Child psychotherapist class/individual counseling at [place], Therapist, sports, BSN Head & Neck Oncology Nurse [place] at [place] [phone]

## 2018-08-12 ENCOUNTER — Telehealth: Payer: Self-pay | Admitting: *Deleted

## 2018-08-12 NOTE — Telephone Encounter (Addendum)
Oncology Nurse Navigator Documentation  Called Mr. Knaus in follow-up to his 10/2 2nd opinion [facility] consultation.  He reported he was advised neck dissection is recommended with adjuvant RT pending results of surgical biopsy.  He indicated he is going to proceed with surgery with Dr. Erik Obey, LVMM last week, has yet to receive call back.  I encouraged him to call Dr. Noreene Filbert office following our conversation, he voiced understanding.  Dr. Isidore Moos provided update.  Gayleen Orem, RN, BSN Head & Neck Oncology Nurse [place] at [place] [phone]

## 2018-08-28 MED FILL — oxyCODONE HCL 5 MG TABS: 5 | 3 days supply | Qty: 30 | Fill #0

## 2018-08-28 NOTE — H&P (Signed)
Otolaryngology Clinic Note  HPI:    Larry Glover is a 32 y.o. male patient of Patient Does Not Have Pcp for preop evaluation.  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  He has seen Dr. Isidore Moos, and also Dr. Sandi Mariscal at [facility]  Recommendation is for radiation to the primary site given the perineural invasion, and either radiation primarily to the neck, or neck dissection with decision on neck radiation depending on the pathology reports.  He has elected the latter.  I discussed the surgery in detail including risks and complications.  Questions were answered and informed consent was obtained.  We will talk with Dr. Enrique Sack about coordinating the dental extractions.  I have sent in a prescription for oxycodone tablets. PMH/Meds/All/SocHx/FamHx/ROS:   PastMedicalHistory  History reviewed. No pertinent past medical history.    PastSurgicalHistory       Past Surgical History:  Procedure Laterality Date  . NO PAST SURGERIES        No family history of bleeding disorders, wound healing problems or difficulty with anesthesia.   SocialHistory  Social History        Socioeconomic History  . Marital status: Married    Spouse name: Not on file  . Number of children: Not on file  . Years of education: Not on file  . Highest education level: Not on file  Occupational History  . Not on file  Social Needs  . Financial resource strain: Not on file  . Food insecurity:    Worry: Not on file    Inability: Not on file  . Transportation needs:    Medical: Not on file    Non-medical: Not on file  Tobacco Use  . Smoking status: Current Every Day Smoker    Types: Cigarettes  . Smokeless tobacco: Never Used  Substance and Sexual Activity  . Alcohol use: Yes    Comment: 0cc.  . Drug use: No  . Sexual activity: Not on file  Lifestyle  . Physical activity:    Days per week: Not on file     Minutes per session: Not on file  . Stress: Not on file  Relationships  . Social connections:    Talks on phone: Not on file    Gets together: Not on file    Attends religious service: Not on file    Active member of club or organization: Not on file    Attends meetings of clubs or organizations: Not on file    Relationship status: Not on file  Other Topics Concern  . Not on file  Social History Narrative  . Not on file       Current Outpatient Medications:  .  chlorhexidine (PERIDEX) 0.12 % solution, 10 mLs., Disp: , Rfl:  .  chlorhexidine (PERIDEX) 0.12 % solution,             , Disp: , Rfl:  .  oxyCODONE (OXY-IR) 5 mg capsule, Take 1-2 capsules (5-10 mg total) by mouth every 4 (four) hours as needed for up to 7 days., Disp: 30 capsule, Rfl: 0  A complete ROS was performed with pertinent positives/negatives noted in the HPI. The remainder of the ROS are negative.    Physical Exam:    There were no vitals taken for this visit. He is trim and muscular.  He has healed nicely with slight limited lateral motion on the left.  No sign of recurrence.  Ramus mandibularis, hypoglossal,  and spinal accessory nerves are all fully functional.  No palpable neck adenopathy. Lungs: Clear to auscultation Heart: Regular rate and rhythm without murmurs Abdomen: Soft, active Extremities: Normal configuration Neurologic: Symmetric, grossly intact.       Impression & Plans:   Satisfactory preoperative check.  Plan: We will proceed next week as planned.  We will coordinate with Dr. Enrique Sack if reasonable.  I discussed recovery including time in the hospital, and time out of work.  I will plan to see him back here 10 days after surgery.  I sent in a prescription for oxycodone tablets.   Lilyan Gilford, MD  56/94/3700

## 2018-08-29 ENCOUNTER — Encounter (HOSPITAL_COMMUNITY): Payer: Self-pay | Admitting: Dentistry

## 2018-08-29 ENCOUNTER — Ambulatory Visit (HOSPITAL_COMMUNITY): Payer: Self-pay | Admitting: Dentistry

## 2018-08-29 VITALS — BP 109/75 | HR 66 | Temp 97.9°F

## 2018-08-29 DIAGNOSIS — Z463 Encounter for fitting and adjustment of dental prosthetic device: Secondary | ICD-10-CM

## 2018-08-29 DIAGNOSIS — K0530 Chronic periodontitis, unspecified: Secondary | ICD-10-CM

## 2018-08-29 DIAGNOSIS — K036 Deposits [accretions] on teeth: Secondary | ICD-10-CM

## 2018-08-29 DIAGNOSIS — K083 Retained dental root: Secondary | ICD-10-CM

## 2018-08-29 DIAGNOSIS — C021 Malignant neoplasm of border of tongue: Secondary | ICD-10-CM

## 2018-08-29 DIAGNOSIS — Z01818 Encounter for other preprocedural examination: Secondary | ICD-10-CM

## 2018-08-29 DIAGNOSIS — K029 Dental caries, unspecified: Secondary | ICD-10-CM

## 2018-08-29 MED ORDER — SODIUM FLUORIDE 1.1 % DT CREA: TOPICAL_CREAM | DENTAL | 99 refills | Status: AC

## 2018-08-29 MED FILL — SF 5000 PLUS CREAM: 1.1 | 20 days supply | Qty: 51 | Fill #0

## 2018-08-29 NOTE — Pre-Procedure Instructions (Signed)
Larry Glover  08/29/2018      [place], [place] [address] Phone: [phone] Fax: [place], [place] [address] Phone: [phone] Fax: [phone]    Your procedure is scheduled on 09/03/18.  Report to [facility] Admitting at 630 A.M.  Call this number if you have problems the morning of surgery:  [phone]   Remember:  Do not eat or drink after midnight. Y   Take these medicines the morning of surgery with A SIP OF WATER ---peridex as directed    Do not wear jewelry, make-up or nail polish.  Do not wear lotions, powders, or perfumes, or deodorant.  Do not shave 48 hours prior to surgery.  Men may shave face and neck.  Do not bring valuables to the hospital.  [facility] is not responsible for any belongings or valuables.  Contacts, dentures or bridgework may not be worn into surgery.  Leave your suitcase in the car.  After surgery it may be brought to your room.  For patients admitted to the hospital, discharge time will be determined by your treatment team.  Patients discharged the day of surgery will not be allowed to drive home.   Name and phone number of your driver:    Special instructions:  Do not take any aspirin,anti-inflammatories,vitamins,or herbal supplements 5-7 days prior to surgery.[place] - Preparing for Surgery  Before surgery, you can play an important role.  Because skin is not sterile, your skin needs to be as free of germs as possible.  You can reduce the number of germs on you skin by washing with CHG (chlorahexidine gluconate) soap before surgery.  CHG is an antiseptic cleaner which kills germs and bonds with the skin to continue killing germs even after washing.  Oral Hygiene is also important in reducing the risk of infection.  Remember to  brush your teeth with your regular toothpaste the morning of surgery.  Please DO NOT use if you have an allergy to CHG or antibacterial soaps.  If your skin becomes reddened/irritated stop using the CHG and inform your nurse when you arrive at Short Stay.  Do not shave (including legs and underarms) for at least 48 hours prior to the first CHG shower.  You may shave your face.  Please follow these instructions carefully:   1.  Shower with CHG Soap the night before surgery and the morning of Surgery.  2.  If you choose to wash your hair, wash your hair first as usual with your normal shampoo.  3.  After you shampoo, rinse your hair and body thoroughly to remove the shampoo. 4.  Use CHG as you would any other liquid soap.  You can apply chg directly to the skin and wash gently with a      scrungie or washcloth.           5.  Apply the CHG Soap to your body ONLY FROM THE NECK DOWN.   Do not use on open wounds or open sores. Avoid contact with your eyes, ears, mouth and genitals (private parts).  Wash genitals (private parts) with your normal soap.  6.  Wash thoroughly, paying special attention to the area where your surgery will be performed.  7.  Thoroughly rinse your body with warm water from the neck down.  8.  DO NOT shower/wash with your normal soap after using and rinsing off the CHG Soap.  9.  Pat yourself dry with a clean towel.            10.  Wear clean pajamas.            11.  Place clean sheets on your bed the night of your first shower and do not sleep with pets.  Day of Surgery  Do not apply any lotions/deoderants the morning of surgery.   Please wear clean clothes to the hospital/surgery center. Remember to brush your teeth with toothpaste.    Please read over the following fact sheets that you were given.

## 2018-08-29 NOTE — Progress Notes (Signed)
08/29/2018  Patient:            Larry Glover Date of Birth:  [birth year] MRN:                [id number]   BP 109/75 (BP Location: Left Arm)   Pulse 66   Temp 97.9 F (36.6 C)    Larry Glover presents for initial periodontal therapy and fabrication of a tongue positioner/radiation cone locator. The patient indicates that he had a second opinion at [facility] in [place]. A decision was made to proceed with neck dissection with Dr. Erik Obey that is currently scheduled for 09/03/2018.  Patient understands that he will still need radiation therapy to treat the left lateral tongue primary site of cancer. I then discussed the need to proceed with referral to an oral surgeon for second opinion concerning extractions recommended at this time.  Tooth numbers 1 and 16 are retained root segments with potential source of infection. Tooth numbers 17-21 will be in the primary field of radiation therapy at a dose of approximately 6000 cGy. Tooth #32 is not in the primary field of radiation therapy but has significant periodontal disease and I would recommend extraction at this time. Patient  is in agreement with the extraction of tooth numbers 1, 16, 17, and 32 but wishes to discuss extraction of tooth numbers 18 through 21 with the oral surgeon. Patient has been scheduled to see Dr. Benson Norway on Monday, 09/02/2018 at 2:30 PM for this second opinion oral surgery consultation and because of the complexity of the extraction procedures.  The patient will then proceed with the oral surgery extractions after the anticipated neck dissection with Dr. Erik Obey on 74/25/9563 as I do not think these extractions can be coordinated with the oral surgeon and the ENT surgeon.  I then discussed the reason for fabrication of the radiation cone locator / tongue positioner.  The patient expressed understanding on why the tongue positioner was needed and agrees to proceed with this continued fabrication today. Patient also expresses  understanding concerning the need for initial periodontal therapy-today.  Procedure: 1. Initial portion of the tongue positioner was fabricated this morning with a total of 2 hours of lab time.   The initial portion of the tongue positioner was then inserted into the mouth and adjusted as needed. Good stability and retention of the tongue positioner involving the lower teeth was noted.  Additional increments of acrylic material were added to the tongue positioner on the left and right side to index the maxillary teeth.  A stable and retentive tongue positioner involving the maxillary teeth and mandibular teeth was obtained. This prosthesis was then adjusted and polished appropriately.Patient was then instructed on taking the tongue positioner in and out.  This was achieved without difficulty.  The tongue positioner will be reinserted after the anticipated neck surgery and oral surgery to ensure that it is still stable and retentive after the anticipated surgeries.  The patient will then use the tongue positioner/radiation cone locater device during the simulation appointment and during subsequent radiation therapy treatment appointments.  2. A gross debridement procedure was then performed utilizing a Cavitron. Gross calculus and prior was removed utilizing a Cavitron. A series of hand curettes were then used to further remove accretions as needed. The Cavitron was then again used to further refine removal of accretions. This completed the gross debridement procedure. Patient is aware that additional periodontal therapy will be needed after radiation therapy with a primary dentist of his  choice.  3. A prescription for PreviDent 5000 Plus was then sent to [facility] with refills for one year. Patient is to place a small amount of toothpaste on the toothbrush. Patient is to brush for 2 minutes. Patient is to spit out excess and not swallow. Patient is to not rinse afterwards. Patient is to  repeat nightly.  A set of fluoride carriers will be fabricated once it is determined what teeth will be extracted by the oral surgeon.  Plan/recommendations: 1. Brush teeth after meals and at bedtime. Floss at bedtime. Use PreviDent 5000 toothpaste at bedtime. 2. Presurgical testing appointment for Dr. Noreene Filbert surgery is scheduled for Friday. 3. Patient has oral surgery consultation on Monday, 09/02/2018 at 2:30 PM. 4. Patient with anticipated neck dissection with Dr. Erik Obey on 81/44/8185. 5. Patient will be scheduled for surgery extractions after adequate healing from neck dissection. 6. Patient will need to return to Dental Medicine for reinsertion and evaluation of the tongue positioner and insertion of the fluoride trays.  Patient is aware that the tongue positioner is worn during the simulation appointment and for subsequent radiation therapy treatment appointments. 7. Patient to call if any other questions or problems arise.   Patient was dismissed in stable condition.   Lenn Cal, DDS

## 2018-08-29 NOTE — Patient Instructions (Signed)
Plan/recommendations: 1. Brush teeth after meals and at bedtime. Floss at bedtime. Use PreviDent 5000 toothpaste at bedtime. 2. Presurgical testing appointment for Dr. Noreene Filbert surgery is scheduled for Friday. 3. Patient has oral surgery consultation on Monday, 09/02/2018 at 2:30 PM. 4. Patient with anticipated neck dissection with Dr. Erik Obey on 09/02/2535. 5. Patient will be scheduled for surgery extractions after adequate healing from neck dissection. 6. Patient will need to return to Dental Medicine for reinsertion and evaluation of the tongue positioner and insertion of the fluoride trays.  Patient is aware that the tongue positioner is worn during the simulation appointment and for subsequent radiation therapy treatment appointments. 7. Patient to call if any other questions or problems arise.

## 2018-08-30 ENCOUNTER — Inpatient Hospital Stay (HOSPITAL_COMMUNITY)
Admission: RE | Admit: 2018-08-30 | Discharge: 2018-08-30 | Disposition: A | Discharge status: Home / Self Care | Payer: Self-pay | Source: Ambulatory Visit

## 2018-09-02 ENCOUNTER — Encounter (HOSPITAL_COMMUNITY): Payer: Self-pay | Admitting: *Deleted

## 2018-09-02 ENCOUNTER — Other Ambulatory Visit: Payer: Self-pay

## 2018-09-02 NOTE — Anesthesia Preprocedure Evaluation (Addendum)
Anesthesia Evaluation  Patient identified by MRN, date of birth, ID band Patient awake    Reviewed: Allergy & Precautions, H&P , NPO status , Patient's Chart, lab work & pertinent test results  Airway Mallampati: I  TM Distance: >3 FB Neck ROM: Full    Dental no notable dental hx. (+) Teeth Intact, Dental Advisory Given   Pulmonary Current Smoker,    Pulmonary exam normal breath sounds clear to auscultation       Cardiovascular Exercise Tolerance: Good negative cardio ROS   Rhythm:Regular Rate:Normal     Neuro/Psych negative neurological ROS  negative psych ROS   GI/Hepatic negative GI ROS, Neg liver ROS,   Endo/Other  negative endocrine ROS  Renal/GU negative Renal ROS  negative genitourinary   Musculoskeletal   Abdominal   Peds  Hematology negative hematology ROS (+)   Anesthesia Other Findings   Reproductive/Obstetrics negative OB ROS                            Anesthesia Physical Anesthesia Plan  ASA: II  Anesthesia Plan: General   Post-op Pain Management:    Induction: Intravenous  PONV Risk Score and Plan: 2 and Ondansetron, Dexamethasone and Midazolam  Airway Management Planned: Oral ETT  Additional Equipment:   Intra-op Plan:   Post-operative Plan: Extubation in OR  Informed Consent: I have reviewed the patients History and Physical, chart, labs and discussed the procedure including the risks, benefits and alternatives for the proposed anesthesia with the patient or authorized representative who has indicated his/her understanding and acceptance.   Dental advisory given  Plan Discussed with: CRNA  Anesthesia Plan Comments:         Anesthesia Quick Evaluation

## 2018-09-03 ENCOUNTER — Telehealth (HOSPITAL_COMMUNITY): Payer: Self-pay | Admitting: Dentistry

## 2018-09-03 ENCOUNTER — Inpatient Hospital Stay (HOSPITAL_COMMUNITY): Payer: Medicaid Other | Admitting: Anesthesiology

## 2018-09-03 ENCOUNTER — Inpatient Hospital Stay (HOSPITAL_COMMUNITY)
Admission: RE | Admit: 2018-09-03 | Discharge: 2018-09-05 | DRG: 130 | Disposition: A | Discharge status: Home / Self Care | Payer: Medicaid Other | Attending: Otolaryngology | Admitting: Otolaryngology

## 2018-09-03 ENCOUNTER — Encounter (HOSPITAL_COMMUNITY): Payer: Self-pay | Admitting: Certified Registered"

## 2018-09-03 ENCOUNTER — Encounter (HOSPITAL_COMMUNITY)
Admission: RE | Disposition: A | Discharge status: Home / Self Care | Payer: Self-pay | Source: Home / Self Care | Attending: Otolaryngology

## 2018-09-03 DIAGNOSIS — F17210 Nicotine dependence, cigarettes, uncomplicated: Secondary | ICD-10-CM | POA: Diagnosis present

## 2018-09-03 DIAGNOSIS — C029 Malignant neoplasm of tongue, unspecified: Secondary | ICD-10-CM | POA: Diagnosis present

## 2018-09-03 HISTORY — PX: RADICAL NECK DISSECTION: SHX2284

## 2018-09-03 SURGERY — DISSECTION, NECK, RADICAL
Anesthesia: General | Site: Neck | Laterality: Left

## 2018-09-03 MED ORDER — 0.9 % SODIUM CHLORIDE (POUR BTL) OPTIME
TOPICAL | Status: DC | PRN
  Administered 2018-09-03: 1000 mL

## 2018-09-03 MED ORDER — LIDOCAINE 2% (20 MG/ML) 5 ML SYRINGE
INTRAMUSCULAR | Status: DC | PRN
  Administered 2018-09-03: 60 mg via INTRAVENOUS

## 2018-09-03 MED ORDER — IBUPROFEN 100 MG/5ML PO SUSP
400.0000 mg | Freq: Four times a day (QID) | ORAL | Status: DC | PRN
  Filled 2018-09-03: qty 20

## 2018-09-03 MED ORDER — PHENYLEPHRINE 40 MCG/ML (10ML) SYRINGE FOR IV PUSH (FOR BLOOD PRESSURE SUPPORT)
PREFILLED_SYRINGE | INTRAVENOUS | Status: DC | PRN
  Administered 2018-09-03: 40 ug via INTRAVENOUS

## 2018-09-03 MED ORDER — LACTATED RINGERS IV SOLN
INTRAVENOUS | Status: DC | PRN
  Administered 2018-09-03: 09:00:00 via INTRAVENOUS

## 2018-09-03 MED ORDER — HYDROMORPHONE HCL 1 MG/ML IJ SOLN
INTRAMUSCULAR | Status: AC
  Filled 2018-09-03: qty 1

## 2018-09-03 MED ORDER — ONDANSETRON HCL 4 MG/2ML IJ SOLN
INTRAMUSCULAR | Status: DC | PRN
  Administered 2018-09-03: 4 mg via INTRAVENOUS

## 2018-09-03 MED ORDER — LIDOCAINE 2% (20 MG/ML) 5 ML SYRINGE
INTRAMUSCULAR | Status: AC
  Filled 2018-09-03: qty 5

## 2018-09-03 MED ORDER — LACTATED RINGERS IV SOLN
INTRAVENOUS | Status: DC
  Administered 2018-09-03: 09:00:00 via INTRAVENOUS

## 2018-09-03 MED ORDER — ROCURONIUM BROMIDE 50 MG/5ML IV SOSY
PREFILLED_SYRINGE | INTRAVENOUS | Status: AC
  Filled 2018-09-03: qty 5

## 2018-09-03 MED ORDER — MIDAZOLAM HCL 2 MG/2ML IJ SOLN: INTRAMUSCULAR | Status: DC | PRN

## 2018-09-03 MED ORDER — FENTANYL CITRATE (PF) 250 MCG/5ML IJ SOLN
INTRAMUSCULAR | Status: AC
  Filled 2018-09-03: qty 5

## 2018-09-03 MED ORDER — MIDAZOLAM HCL 2 MG/2ML IJ SOLN
INTRAMUSCULAR | Status: AC
  Filled 2018-09-03: qty 2

## 2018-09-03 MED ORDER — CHLORHEXIDINE GLUCONATE CLOTH 2 % EX PADS: 6.0000 | MEDICATED_PAD | Freq: Once | CUTANEOUS | Status: DC

## 2018-09-03 MED ORDER — BACITRACIN ZINC 500 UNIT/GM EX OINT
1.0000 "application " | TOPICAL_OINTMENT | Freq: Three times a day (TID) | CUTANEOUS | Status: DC
  Administered 2018-09-03 – 2018-09-05 (×6): 1 via TOPICAL
  Filled 2018-09-03 (×2): qty 28.35

## 2018-09-03 MED ORDER — PROPOFOL 10 MG/ML IV BOLUS
INTRAVENOUS | Status: AC
  Filled 2018-09-03: qty 40

## 2018-09-03 MED ORDER — DEXMEDETOMIDINE HCL IN NACL 200 MCG/50ML IV SOLN
INTRAVENOUS | Status: DC | PRN
  Administered 2018-09-03 (×4): 4 ug via INTRAVENOUS
  Administered 2018-09-03: 8 ug via INTRAVENOUS
  Administered 2018-09-03: 4 ug via INTRAVENOUS

## 2018-09-03 MED ORDER — DEXAMETHASONE SODIUM PHOSPHATE 10 MG/ML IJ SOLN
INTRAMUSCULAR | Status: DC | PRN
  Administered 2018-09-03: 10 mg via INTRAVENOUS

## 2018-09-03 MED ORDER — BACITRACIN ZINC 500 UNIT/GM EX OINT
TOPICAL_OINTMENT | CUTANEOUS | Status: AC
  Filled 2018-09-03: qty 28.35

## 2018-09-03 MED ORDER — LIDOCAINE-EPINEPHRINE 1 %-1:100000 IJ SOLN
INTRAMUSCULAR | Status: DC | PRN
  Administered 2018-09-03: 10 mL

## 2018-09-03 MED ORDER — SUCCINYLCHOLINE CHLORIDE 200 MG/10ML IV SOSY: PREFILLED_SYRINGE | INTRAVENOUS | Status: DC | PRN

## 2018-09-03 MED ORDER — HYDROMORPHONE HCL 1 MG/ML IJ SOLN
0.2500 mg | INTRAMUSCULAR | Status: DC | PRN
  Administered 2018-09-03 (×2): 0.5 mg via INTRAVENOUS

## 2018-09-03 MED ORDER — ONDANSETRON HCL 4 MG/2ML IJ SOLN: 4.0000 mg | INTRAMUSCULAR | Status: DC | PRN

## 2018-09-03 MED ORDER — PHENYLEPHRINE 40 MCG/ML (10ML) SYRINGE FOR IV PUSH (FOR BLOOD PRESSURE SUPPORT)
PREFILLED_SYRINGE | INTRAVENOUS | Status: AC
  Filled 2018-09-03: qty 10

## 2018-09-03 MED ORDER — DEXTROSE-NACL 5-0.45 % IV SOLN
INTRAVENOUS | Status: DC
  Administered 2018-09-03 – 2018-09-04 (×2): via INTRAVENOUS

## 2018-09-03 MED ORDER — BACITRACIN ZINC 500 UNIT/GM EX OINT
TOPICAL_OINTMENT | CUTANEOUS | Status: DC | PRN
  Administered 2018-09-03: 1 via TOPICAL

## 2018-09-03 MED ORDER — HEPARIN SODIUM (PORCINE) 5000 UNIT/ML IJ SOLN
5000.0000 [IU] | Freq: Three times a day (TID) | INTRAMUSCULAR | Status: DC
  Administered 2018-09-03 – 2018-09-04 (×2): 5000 [IU] via SUBCUTANEOUS
  Filled 2018-09-03 (×4): qty 1

## 2018-09-03 MED ORDER — SUCCINYLCHOLINE CHLORIDE 200 MG/10ML IV SOSY
PREFILLED_SYRINGE | INTRAVENOUS | Status: AC
  Filled 2018-09-03: qty 10

## 2018-09-03 MED ORDER — CEFAZOLIN SODIUM-DEXTROSE 2-4 GM/100ML-% IV SOLN
2.0000 g | INTRAVENOUS | Status: AC
  Administered 2018-09-03: 2 g via INTRAVENOUS
  Filled 2018-09-03: qty 100

## 2018-09-03 MED ORDER — LIDOCAINE-EPINEPHRINE 1 %-1:100000 IJ SOLN
INTRAMUSCULAR | Status: AC
  Filled 2018-09-03: qty 1

## 2018-09-03 MED ORDER — PROPOFOL 10 MG/ML IV BOLUS
INTRAVENOUS | Status: DC | PRN
  Administered 2018-09-03: 40 mg via INTRAVENOUS
  Administered 2018-09-03: 150 mg via INTRAVENOUS
  Administered 2018-09-03 (×2): 50 mg via INTRAVENOUS

## 2018-09-03 MED ORDER — ONDANSETRON HCL 4 MG PO TABS: 4.0000 mg | ORAL_TABLET | ORAL | Status: DC | PRN

## 2018-09-03 MED ORDER — ONDANSETRON HCL 4 MG/2ML IJ SOLN
INTRAMUSCULAR | Status: AC
  Filled 2018-09-03: qty 2

## 2018-09-03 MED ORDER — FENTANYL CITRATE (PF) 100 MCG/2ML IJ SOLN
INTRAMUSCULAR | Status: DC | PRN
  Administered 2018-09-03: 50 ug via INTRAVENOUS
  Administered 2018-09-03 (×2): 100 ug via INTRAVENOUS
  Administered 2018-09-03 (×2): 50 ug via INTRAVENOUS
  Administered 2018-09-03: 100 ug via INTRAVENOUS
  Administered 2018-09-03: 50 ug via INTRAVENOUS

## 2018-09-03 MED ORDER — ROCURONIUM BROMIDE 100 MG/10ML IV SOLN
INTRAVENOUS | Status: DC | PRN
  Administered 2018-09-03: 10 mg via INTRAVENOUS
  Administered 2018-09-03: 40 mg via INTRAVENOUS

## 2018-09-03 MED ORDER — MIDAZOLAM HCL 2 MG/2ML IJ SOLN
INTRAMUSCULAR | Status: DC | PRN
  Administered 2018-09-03: 2 mg via INTRAVENOUS

## 2018-09-03 MED ORDER — SUCCINYLCHOLINE CHLORIDE 200 MG/10ML IV SOSY
PREFILLED_SYRINGE | INTRAVENOUS | Status: DC | PRN
  Administered 2018-09-03: 100 mg via INTRAVENOUS

## 2018-09-03 MED ORDER — OXYCODONE HCL 5 MG PO TABS
5.0000 mg | ORAL_TABLET | ORAL | Status: DC | PRN
  Administered 2018-09-03 – 2018-09-05 (×11): 10 mg via ORAL
  Filled 2018-09-03 (×12): qty 2

## 2018-09-03 MED ORDER — DEXAMETHASONE SODIUM PHOSPHATE 10 MG/ML IJ SOLN
INTRAMUSCULAR | Status: AC
  Filled 2018-09-03: qty 1

## 2018-09-03 MED ORDER — LIDOCAINE 2% (20 MG/ML) 5 ML SYRINGE: INTRAMUSCULAR | Status: DC | PRN

## 2018-09-03 MED ORDER — SUGAMMADEX SODIUM 200 MG/2ML IV SOLN
INTRAVENOUS | Status: DC | PRN
  Administered 2018-09-03: 150 mg via INTRAVENOUS

## 2018-09-03 SURGICAL SUPPLY — 54 items
APPLIER CLIP 9.375 SM OPEN (CLIP) ×3
APR CLP SM 9.3 20 MLT OPN (CLIP) ×1
ATTRACTOMAT 16X20 MAGNETIC DRP (DRAPES) ×3 IMPLANT
BLADE SURG 10 STRL SS (BLADE) ×3 IMPLANT
BLADE SURG 15 STRL LF DISP TIS (BLADE) ×1 IMPLANT
BLADE SURG 15 STRL SS (BLADE) ×3
CANISTER SUCT 3000ML PPV (MISCELLANEOUS) ×3 IMPLANT
CLEANER TIP ELECTROSURG 2X2 (MISCELLANEOUS) ×3 IMPLANT
CLIP APPLIE 9.375 SM OPEN (CLIP) ×1 IMPLANT
CONT SPEC 4OZ CLIKSEAL STRL BL (MISCELLANEOUS) ×3 IMPLANT
CORD BIPOLAR FORCEPS 12FT (ELECTRODE) ×2 IMPLANT
COVER SURGICAL LIGHT HANDLE (MISCELLANEOUS) ×3 IMPLANT
COVER WAND RF STERILE (DRAPES) ×3 IMPLANT
CRADLE DONUT ADULT HEAD (MISCELLANEOUS) ×3 IMPLANT
DRAIN CHANNEL 10F 3/8 F FF (DRAIN) IMPLANT
DRAIN CHANNEL 15F RND FF W/TCR (WOUND CARE) IMPLANT
DRAIN PENROSE 1/2X12 LTX STRL (WOUND CARE) IMPLANT
DRAIN TLS ROUND 10FR (DRAIN) ×3 IMPLANT
DRAPE HALF SHEET 40X57 (DRAPES) ×2 IMPLANT
ELECT COATED BLADE 2.86 ST (ELECTRODE) ×3 IMPLANT
ELECT REM PT RETURN 9FT ADLT (ELECTROSURGICAL) ×3
ELECTRODE REM PT RTRN 9FT ADLT (ELECTROSURGICAL) ×1 IMPLANT
EVACUATOR SILICONE 100CC (DRAIN) ×3 IMPLANT
FORCEPS BIPOLAR SPETZLER 8 1.0 (NEUROSURGERY SUPPLIES) ×4 IMPLANT
GAUZE 4X4 16PLY RFD (DISPOSABLE) ×6 IMPLANT
GLOVE ECLIPSE 8.0 STRL XLNG CF (GLOVE) ×3 IMPLANT
GOWN STRL REUS W/ TWL LRG LVL3 (GOWN DISPOSABLE) ×1 IMPLANT
GOWN STRL REUS W/ TWL XL LVL3 (GOWN DISPOSABLE) ×1 IMPLANT
GOWN STRL REUS W/TWL LRG LVL3 (GOWN DISPOSABLE) ×9
GOWN STRL REUS W/TWL XL LVL3 (GOWN DISPOSABLE) ×3
KIT BASIN OR (CUSTOM PROCEDURE TRAY) ×3 IMPLANT
KIT TURNOVER KIT B (KITS) ×3 IMPLANT
LOCATOR NERVE 3 VOLT (DISPOSABLE) ×2 IMPLANT
NDL HYPO 25GX1X1/2 BEV (NEEDLE) IMPLANT
NEEDLE HYPO 25GX1X1/2 BEV (NEEDLE) ×3 IMPLANT
NS IRRIG 1000ML POUR BTL (IV SOLUTION) ×3 IMPLANT
PAD ARMBOARD 7.5X6 YLW CONV (MISCELLANEOUS) ×6 IMPLANT
PENCIL BUTTON HOLSTER BLD 10FT (ELECTRODE) ×3 IMPLANT
SPECIMEN JAR MEDIUM (MISCELLANEOUS) ×3 IMPLANT
SPONGE INTESTINAL PEANUT (DISPOSABLE) ×2 IMPLANT
SPONGE LAP 18X18 X RAY DECT (DISPOSABLE) ×3 IMPLANT
STAPLER VISISTAT 35W (STAPLE) ×3 IMPLANT
SUT CHROMIC 4 0 P 3 18 (SUTURE) ×3 IMPLANT
SUT CHROMIC 4 0 PS 2 18 (SUTURE) ×8 IMPLANT
SUT CHROMIC 5 0 P 3 (SUTURE) IMPLANT
SUT ETHILON 3 0 PS 1 (SUTURE) ×3 IMPLANT
SUT ETHILON 5 0 PS 2 18 (SUTURE) ×3 IMPLANT
SUT SILK 2 0 REEL (SUTURE) IMPLANT
SUT SILK 2 0 SH CR/8 (SUTURE) ×3 IMPLANT
SUT SILK 3 0 REEL (SUTURE) ×5 IMPLANT
SUT SILK 4 0 REEL (SUTURE) ×3 IMPLANT
TOWEL OR 17X24 6PK STRL BLUE (TOWEL DISPOSABLE) ×3 IMPLANT
TRAY ENT MC OR (CUSTOM PROCEDURE TRAY) ×3 IMPLANT
TRAY FOLEY MTR SLVR 14FR STAT (SET/KITS/TRAYS/PACK) IMPLANT

## 2018-09-03 NOTE — Op Note (Signed)
09/03/2018  11:30 AM    Larry Glover  [id number]   Pre-Op Dx: Left tongue cancer  Post-op Dx: Same  Proc: Functional left neck dissection  Surg:  Jodi Marble T MD  Ass't:  Marcellino MD  Anes:  GOT  EBL: 25 mL  Comp: None  Findings: Multiple small lymph nodes in level 2A, 2B, and 3.  Procedure: The patient was examined in the holding area.  The correct side was marked.  A surgical wound was marked.  In a comfortable supine position in the operating room, general orotracheal anesthesia was induced without difficulty.  At an appropriate level, the patient was placed in a slight reverse Trendelenburg.  A shoulder roll was placed, the neck was extended and rotated to the right, and the head was supported on a donut.  The proposed incision was infiltrated with 1% Xylocaine with 1-100,000 epinephrine, 10 cc total.  A Hibiclens sterile preparation and draping of the entire left neck was performed.  The proposed wound was crosshatched for orientation.  A 12 cm incision was sharply executed and carried down through skin, subcutaneous fat, and platysma muscle.  Inferior and superior flaps were generated subplatysmal.  This was carried back to the sternocleidomastoid muscle, and anteriorly to the opposite belly of the digastric.  It was carried up to the horizontal ramus of the mandible.  The soft tissue between the anterior bellies of the digastric were dissected and the soft tissue was brought posteriorly.  Fascia below the ramus mandibularis nerve was dissected off the submandibular   Gland.  The facial nerve identified and controlled.  No adenopathy was identified.  dissection was carried from anterior to posterior from superior to inferior down to the digastric muscle.    The fascia was stripped off of the lateral surface of the sternocleidomastoid muscle including the external jugular vein with an identified node.  This was rolled anteriorly around the free edge of the  sternocleidomastoid muscle and carried down to the muscular floor of the posterior fossa.  The spinal accessory nerve was identified and dissected upward and downward.  Posterior superior corner of the level to be was dissected from beneath the sternocleidomastoid muscle, on top of the levator scapulae, and off the muscular floor and brought forward under the spinal accessory nerve.  Dissecting forward, cervical roots were identified and were left long.  Dissection was carried up until the carotid artery was identified.  Sharp dissection was used to roll the fascia off the carotid artery up onto the jugular vein and off the jugular vein from posterior to anterior.  Small branches were controlled with silk ligature.  Working from level 3-4 onto the lateral surface of larynx, across the hyoid bone, and then communicating with the submandibular triangle, the soft tissues were dissected forward and finally amputated.  The wound was thoroughly irrigated.  With Valsalva, several small bleeding sites were controlled with bipolar cautery.  An independent puncture site was generated for the drain which was laid into the wound and secured at the skin with a 4-0 nylon.  The shoulder roll was removed and the head was turned back to neutral.  The wound was closed with interrupted 4-0 chromic suture, followed by skin staples.  Bacitracin ointment was applied.  The drain was observed to be intact and functional.  At this point the procedure was completed.  The patient was returned to anesthesia, awakened, extubated, and transferred to recovery in stable condition.  Dispo:   PACU to 6 N.  Plan: Suction drainage.  Ice, elevation.  Await pathology report.  If he has metastatic adenopathy, he will require radiation to the tongue primary and to the neck.  If there are no metastatic nodes, he will receive radiation to the tongue only.  Anticipate that he will be in the hospital 2-3 days.  Tyson Alias MD

## 2018-09-03 NOTE — Telephone Encounter (Signed)
09/03/2018   Patient:            Larry Glover Date of Birth:  Mar 17, 1986 MRN:                [id number]   I called Dr. Raynelle Dick office and spoke with Amy. The patient was seen for oral surgery consultation for a preradiation therapy evaluation. The patient is to proceed with extraction of tooth numbers 1, 16, 17, 18, 19, 20, 21, and 32 with Dr. Benson Norway on November 12 at 9 AM. This will allow for 2 weeks of healing after the neck dissection performed by Dr. Erik Obey today. Patient will then need to be seen by Dental Medicine for evaluation of the previously fabricated radiation cone locator hard to simulation appointment with Dr. Isidore Moos. Dr. Isidore Moos is to let me know when they are schedule the patient for simulation to allow for time to try the radiation cone locator.   Dr. Enrique Sack

## 2018-09-03 NOTE — Transfer of Care (Signed)
Immediate Anesthesia Transfer of Care Note  Patient: Larry Glover  Procedure(s) Performed: RADICAL NECK DISSECTION,SELECTIVE LEFT NECK DISSECTION (Left Neck)  Patient Location: PACU  Anesthesia Type:General  Level of Consciousness: awake, alert  and patient cooperative  Airway & Oxygen Therapy: Patient Spontanous Breathing  Post-op Assessment: Report given to RN and Post -op Vital signs reviewed and stable  Post vital signs: Reviewed and stable  Last Vitals:  Vitals Value Taken Time  BP 114/86 09/03/2018 12:06 PM  Temp    Pulse 75 09/03/2018 12:09 PM  Resp 9 09/03/2018 12:09 PM  SpO2 94 % 09/03/2018 12:09 PM  Vitals shown include unvalidated device data.  Last Pain: There were no vitals filed for this visit.       Complications: No apparent anesthesia complications

## 2018-09-03 NOTE — Anesthesia Postprocedure Evaluation (Signed)
Anesthesia Post Note  Patient: Larry Glover  Procedure(s) Performed: RADICAL NECK DISSECTION,SELECTIVE LEFT NECK DISSECTION (Left Neck)     Patient location during evaluation: PACU Anesthesia Type: General Level of consciousness: awake and alert Pain management: pain level controlled Vital Signs Assessment: post-procedure vital signs reviewed and stable Respiratory status: spontaneous breathing, nonlabored ventilation and respiratory function stable Cardiovascular status: blood pressure returned to baseline and stable Postop Assessment: no apparent nausea or vomiting Anesthetic complications: no    Last Vitals:  Vitals:   09/03/18 1305 09/03/18 1306  BP:  105/84  Pulse:  76  Resp:  13  Temp: 36.7 C   SpO2:  93%    Last Pain:  Vitals:   09/03/18 1230  PainSc: Asleep                 Deona Novitski,W. EDMOND

## 2018-09-03 NOTE — Interval H&P Note (Signed)
History and Physical Interval Note:  09/03/2018 8:36 AM  Larry Glover  has presented today for surgery, with the diagnosis of LEFT TONGUE CANCER  The various methods of treatment have been discussed with the patient and family. After consideration of risks, benefits and other options for treatment, the patient has consented to  Procedure(s): [place] (Left) as a surgical intervention .  The patient's history has been re-reviewed, patient re-examined, no change in status, stable for surgery.  I have re-reviewed the patient's chart and labs.  Questions were answered to the patient's satisfaction.     Ileene Hutchinson [facility]

## 2018-09-03 NOTE — Anesthesia Procedure Notes (Signed)
Procedure Name: Intubation Date/Time: 09/03/2018 8:54 AM Performed by: Lily Peer, Summer, RN Pre-anesthesia Checklist: Patient identified, Emergency Drugs available, Suction available and Patient being monitored Patient Re-evaluated:Patient Re-evaluated prior to induction Oxygen Delivery Method: Circle system utilized Preoxygenation: Pre-oxygenation with 100% oxygen Induction Type: IV induction Ventilation: Mask ventilation without difficulty Grade View: Grade I Tube size: 7.5 mm Number of attempts: 1 Airway Equipment and Method: Stylet Placement Confirmation: ETT inserted through vocal cords under direct vision,  positive ETCO2,  CO2 detector and breath sounds checked- equal and bilateral Secured at: 23 cm Tube secured with: Tape

## 2018-09-03 NOTE — Progress Notes (Signed)
ENT Post Operative Note  Subjective: No nausea, no vomiting No difficulty voiding Pain well controlled Up and ammbulating  Vitals:   09/03/18 1306 09/03/18 1527  BP: 105/84 116/82  Pulse: 76 74  Resp: 13 14  Temp:  98.2 F (36.8 C)  SpO2: 93% 97%     OBJECTIVE  Gen: alert, cooperative, appropriate Head/ENT: EOMI, neck supple, mucus membranes moist and pink, conjunctiva clear Incisions c/d/i, JP drain with moderate sanguinous draiange. Left facial swelling is soft.  Face moves symmetrically with exception of left lower lip weakness on smile, normal pucker Respiratory: Voice without dysphonia. non-labored breathing, no accessory muscle use, normal HR, good O2 saturations    ASSESS/ PLAN  Larry Glover is a 32 y.o. male who is POD 0 from left SND.  -pain control -MIVF- will saline lock when tolerating PO intake -wound care, routine -JP drain care -Ice to left neck/face for 15 min intervals  Helayne Seminole, MD

## 2018-09-04 ENCOUNTER — Encounter (HOSPITAL_COMMUNITY): Payer: Self-pay | Admitting: Otolaryngology

## 2018-09-04 MED ORDER — ZOLPIDEM TARTRATE 5 MG PO TABS
5.0000 mg | ORAL_TABLET | Freq: Every evening | ORAL | Status: DC | PRN
  Administered 2018-09-04 (×2): 5 mg via ORAL
  Filled 2018-09-04 (×2): qty 1

## 2018-09-04 NOTE — Care Management Note (Signed)
Case Management Note  Patient Details  Name: Larry Glover MRN: [id number] Date of Birth: 17-May-1986  Subjective/Objective:                    Action/Plan:  Will continue to follow for discharge needs. Will provide [place] letter ( medication assistance on day of discharge).  Expected Discharge Date:                  Expected Discharge Plan:  Home/Self Care  In-House Referral:  Financial Counselor  Discharge planning Services  CM Consult, Medication Assistance, [place], [facility]  Post Acute Care Choice:  NA Choice offered to:     DME Arranged:    DME Agency:     HH Arranged:    HH Agency:     Status of Service:  In process, will continue to follow  If discussed at Long Length of Stay Meetings, dates discussed:    Additional Comments:  Marilu Favre, RN 09/04/2018, 10:52 AM

## 2018-09-04 NOTE — Progress Notes (Signed)
09/04/2018 10:50 AM  Lanae Crumbly [id number]  Post-Op Day 1    Temp:  [97.6 F (36.4 C)-98.2 F (36.8 C)] 97.6 F (36.4 C) (10/30 1004) Pulse Rate:  [66-89] 71 (10/30 1004) Resp:  [9-20] 18 (10/30 1004) BP: (105-126)/(62-91) 113/79 (10/30 1004) SpO2:  [92 %-99 %] 98 % (10/30 1004) Weight:  [85.4 kg] 85.4 kg (10/29 1527),     Intake/Output Summary (Last 24 hours) at 09/04/2018 1050 Last data filed at 09/04/2018 0700 Gross per 24 hour  Intake 3245.58 ml  Output 235 ml  Net 3010.58 ml   Drain 75 ml last PM, less drainage today.  No results found for this or any previous visit (from the past 24 hour(s)).  SUBJECTIVE:  Mild pain.  Breathing, swallowing, voiding well.    OBJECTIVE:  Neck flat.  Drain functional.  r mandibularis weak LEFT.  Hypoglossal and spinal accessory good.    IMPRESSION:  Satisfactory check  PLAN:  Possible drain out and discharge home in AM.  D/C IV.  Ambien for sleep.  Ileene Hutchinson [facility]

## 2018-09-05 MED ORDER — OXYCODONE HCL 5 MG PO TABS: 5.0000 mg | ORAL_TABLET | ORAL | 0 refills | Status: DC | PRN

## 2018-09-05 MED FILL — oxyCODONE HCL 5 MG TABS: 5 | 3 days supply | Qty: 30 | Fill #0

## 2018-09-05 NOTE — Discharge Instructions (Signed)
Keep head elevated 2-3 nights Recheck my office 1 week .  [phone] for an appointment. I will remove the staples then. OK to shower Clean wound line with Qtip and water twice daily (shower counts as once) and then apply a thin coat of antibiotic ointment (neosporin, bacitracin, or similar) No strenuous activity x 2 weeks No driving while you are taking narcotics and until you can turn your neck easily. You can get Dr. Benson Norway to do your dental extractions soon. Call for a follow up appointment with Dr. Isidore Moos 1-2 wks after your dental extractions.

## 2018-09-05 NOTE — Progress Notes (Signed)
Pt discharged home with wife, pt left ambulatory with wife

## 2018-09-05 NOTE — Discharge Summary (Signed)
09/05/2018 8:59 AM  Larry Glover [id number]  Post-Op Day 2 Discharge summary    Temp:  [97.6 F (36.4 C)-98.2 F (36.8 C)] 98 F (36.7 C) (10/31 0544) Pulse Rate:  [70-84] 70 (10/31 0544) Resp:  [16-20] 16 (10/31 0544) BP: (113-128)/(78-90) 118/85 (10/31 0835) SpO2:  [97 %-98 %] 98 % (10/31 0544),     Intake/Output Summary (Last 24 hours) at 09/05/2018 0859 Last data filed at 09/05/2018 0340 Gross per 24 hour  Intake 600 ml  Output 60 ml  Net 540 ml   Drain: minimal  Pathology:  0/23 lymph nodes positive for cancer.  No results found for this or any previous visit (from the past 24 hour(s)).  SUBJECTIVE:  Some neck pain.  Eating and drinking well.  Bites the left corner of his mouth on occasion.  OBJECTIVE:  Neck flat.  Drain removed.    IMPRESSION:  Satisfactory check  PLAN:   Discharge to home and care of family.    Admit:  76 OCT Discharge:  82 OCT Final diagnosis:  Stage I LEFT tongue cancer Proc:  LEFT modified neck dissection, 29 OCT Comp:  None  Cond:  Ambulatory, pain controlled.  Drain out.  Eating and drinking regular diet.  Voiding well.  Rx:  Oxycodone 5 mg  R/v:  1 week.  Dr Benson Norway and Dr Isidore Moos to be scheduled.  Instructions written and given  Hosp Course: Underwent neck dissection on day of admission.  Observed for drainage x 48 hrs. Without difficulty.  Drain removed and discharged home on POD 2.    Ileene Hutchinson [facility]

## 2018-09-06 MED FILL — AMOX-CLAV 500-125 MG TABLET: 500-125 | 10 days supply | Qty: 20 | Fill #0

## 2018-09-13 MED FILL — ALPRAZolam 1 MG TABS: 1 | 30 days supply | Qty: 30 | Fill #0

## 2018-09-13 MED FILL — oxyCODONE HCL 5 MG TABS: 5 | 3 days supply | Qty: 30 | Fill #0

## 2018-09-16 ENCOUNTER — Other Ambulatory Visit: Payer: Self-pay | Admitting: *Deleted

## 2018-09-16 ENCOUNTER — Telehealth: Payer: Self-pay | Admitting: *Deleted

## 2018-09-16 DIAGNOSIS — C021 Malignant neoplasm of border of tongue: Secondary | ICD-10-CM

## 2018-09-16 NOTE — Telephone Encounter (Addendum)
Oncology Nurse Navigator Documentation  Spoke with Mr. Staffa to confirm next week's appts:  11/19 - [place], [place]  11/20 - 0830 Nurse Eval, 0900 Dr. Isidore Moos, 1000 CT SIM He voiced understanding.  Gayleen Orem, RN, BSN Head & Neck Oncology Nurse [place] at [place] [phone]

## 2018-09-16 NOTE — Telephone Encounter (Signed)
Oncology Nurse Navigator Documentation  Called Mr. Larry Glover to inform I will be coordinating same day appts for his return to [facility] the week of 11/18 to see Dr. Isidore Moos, for CT SIM, Dr. Enrique Sack.  He acknowledged I will be calling him with appt date/times.  He stated he is having 8 extractions tomorrow with oral surgeon Frederik Schmidt.  I noted this works well for next week's appts, explained RT typically starts approx. 2 weeks after extractions pending his release by Dr. Benson Norway.  Drs. Isidore Moos and [place] updated.  Gayleen Orem, RN, BSN Head & Neck Oncology Nurse [place] at [place] [phone]

## 2018-09-17 MED FILL — oxyCODONE HCL 5 MG TABS: 5 | 2 days supply | Qty: 15 | Fill #0

## 2018-09-17 MED FILL — AMOXICILLIN 500 MG CAPSULE: 500 | 5 days supply | Qty: 15 | Fill #0

## 2018-09-18 NOTE — Progress Notes (Signed)
Head and Neck Cancer Location of Tumor / Histology:  06/28/18 Diagnosis 1. Tongue, biopsy, Dorsal Mucosa - REACTIVE SQUAMOUS MUCOSA - NO CARCINOMA IDENTIFIED 2. Tongue, biopsy, Deep Muscle Margin - BENIGN MUSCLE - NO CARCINOMA IDENTIFIED 3. Tongue, resection for tumor, Left Partial - INVASIVE SQUAMOUS CELL CARCINOMA, MODERATELY DIFFERENTIATED (0.7 CM) - PERINEURAL INVASION PRESENT - MARGINS UNINVOLVED BY CARCINOMA - SEE ONCOLOGY TABLE AND COMMENT BELOW  07/02/18 Diagnosis Consult Slide , Tongue Ulceration - SQUAMOUS CELL CARCINOMA, INVASIVE  09/03/18 Diagnosis 1. Lymph node, biopsy, left neck level one - TWO BENIGN LYMPH NODES (0/2). 2. Lymph node, biopsy, left neck level two - ONE BENIGN LYMPH NODE (0/1). 3. Lymph node, biopsy, left neck level three - ONE BENIGN LYMPH NODE (0/1). 4. Lymph node, biopsy, left neck level four - ONE BENIGN LYMPH NODE (0/1). 5. Lymph nodes, regional resection, left functional neck - EIGHTEEN BENIGN LYMPH NODES (0/18).  Patient presented to the ED on 06/02/18 with symptoms of: He states that he noticed this sometime in January earlier this year. He states he noticed a lesion to the left lateral edge of the tongue which he described as "almost like a pimple ". He states that since then the lesion has grown into a shallow ulceration and is quite tender. He notes a sharp stinging pain to this lesion which worsens with exposure to acidic foods, spicy foods, and when he smokes.   Biopsies of tongue revealed: Invasive squamus cell carcinoma, moderately differentiated. Perineural invasion is present.   Nutrition Status Yes No Comments  Weight changes? [x]  []  He reports losing 35 lbs since January.   Swallowing concerns? [x]  []  He is swallowing softer foods, due to surgery.   PEG? []  [x]     Referrals Yes No Comments  Social Work? []  [x]    Dentistry? [x]  []  Dr. Benson Norway performed 8 extractions on 08/17/18, Dr. Enrique Sack 09/24/18   Swallowing therapy? []  [x]    Nutrition? []  [x]    Med/Onc? []  [x]     Safety Issues Yes No Comments  Prior radiation? []  [x]    Pacemaker/ICD? []  [x]    Possible current pregnancy? []  [x]    Is the patient on methotrexate? []  [x]     Tobacco/Marijuana/Snuff/ETOH use: He drinks alcohol socially. He is smoking marijuana occasionally. He has quit smoking cigarettes.   Past/Anticipated interventions by otolaryngology, if any:  06/28/18 Proc:Partial LEFT glossectomy Surg: Jodi Marble T MD  32/12/24 Proc: Functional left neck dissection Surg:  Jodi Marble T MD  Past/Anticipated interventions by medical oncology, if any:  None scheduled.   Current Complaints / other details:    BP (!) 126/98 (BP Location: Left Arm)   Pulse 85   Temp 98.2 F (36.8 C)   Resp 20   Ht 5\' 11"  (1.803 m)   Wt 182 lb 3.2 oz (82.6 kg)   SpO2 99% Comment: room air  BMI 25.41 kg/m    Wt Readings from Last 3 Encounters:  09/25/18 182 lb 3.2 oz (82.6 kg)  09/03/18 188 lb 4.4 oz (85.4 kg)  07/26/18 176 lb 12.8 oz (80.2 kg)

## 2018-09-18 NOTE — Progress Notes (Addendum)
error 

## 2018-09-23 ENCOUNTER — Telehealth: Payer: Self-pay | Admitting: *Deleted

## 2018-09-23 NOTE — Telephone Encounter (Signed)
Oncology Nurse Navigator Documentation  LVMM reminding pt of 9:15 lab tomorrow morning prior to 10:00 appt with Dental Medicine.  Encouraged him to arrive before 9:00 to ensure timely registration.  Gayleen Orem, RN, BSN Head & Neck Oncology Nurse [place] at [place] [phone]

## 2018-09-24 ENCOUNTER — Ambulatory Visit (HOSPITAL_COMMUNITY): Payer: Self-pay | Admitting: Dentistry

## 2018-09-24 ENCOUNTER — Inpatient Hospital Stay: Payer: Medicaid Other | Attending: Radiation Oncology

## 2018-09-24 ENCOUNTER — Encounter (HOSPITAL_COMMUNITY): Payer: Self-pay | Admitting: Dentistry

## 2018-09-24 VITALS — BP 99/82 | HR 83 | Temp 98.5°F

## 2018-09-24 DIAGNOSIS — C021 Malignant neoplasm of border of tongue: Secondary | ICD-10-CM

## 2018-09-24 DIAGNOSIS — Z463 Encounter for fitting and adjustment of dental prosthetic device: Secondary | ICD-10-CM

## 2018-09-24 DIAGNOSIS — K08199 Complete loss of teeth due to other specified cause, unspecified class: Secondary | ICD-10-CM

## 2018-09-24 DIAGNOSIS — R634 Abnormal weight loss: Secondary | ICD-10-CM

## 2018-09-24 DIAGNOSIS — C01 Malignant neoplasm of base of tongue: Secondary | ICD-10-CM | POA: Insufficient documentation

## 2018-09-24 DIAGNOSIS — Z01818 Encounter for other preprocedural examination: Secondary | ICD-10-CM

## 2018-09-24 LAB — BUN & CREATININE (CHCC)
BUN: 8 mg/dL (ref 6–20)
CREATININE: 1.23 mg/dL (ref 0.61–1.24)
GFR, Est AFR Am: >60 mL/min (ref >60)

## 2018-09-24 LAB — TSH: TSH: 0.443 u[IU]/mL (ref 0.320–4.118)

## 2018-09-24 MED ORDER — SODIUM FLUORIDE 1.1 % DT GEL: DENTAL | 99 refills | Status: AC

## 2018-09-24 NOTE — Patient Instructions (Signed)

## 2018-09-24 NOTE — Progress Notes (Signed)
09/24/2018  Patient Name:   Larry Glover Date of Birth:   12/25/1985 Medical Record Number: [id number]  BP 99/82 (BP Location: Left Arm)   Pulse 83   Temp 98.5 F (36.9 C)   [name] now presents for insertion of upper and lower fluoride trays and retry N of the previously fabricated tongue positioner after the dental extractions.  SUBJECTIVE: Patient with minimal discomfort from extractions with the oral surgeon, Dr. Frederik Schmidt. Patient had tooth numbers 1, 16, 17, 18, 19, 20, 21, and 32 extracted by the oral surgeon on 09/17/2018.patient scheduled to see Dr. Benson Norway later this afternoon for postop evaluation and discussion of date patient will be cleared for radiation therapy.  OBJECTIVE: No sign of infection, heme, or ooze. Extraction sites appear to be healing in well. Sutures are intact involving the lower left quadrant.  ASSESSMENT: Postop course appears to be consistent with extractions performed by the oral surgeon. Loss of teeth due to extraction  PROCEDURE: Fluoride appliances were tried in and adjusted as needed. [place] We discussed use of PreviDent 5000 and FluoriSHIELD in the fluoride trays. Patient agrees to have the FluoriSHIELD prescribed at this time with refills for one year.patient understands that he will use one or the other types of fluoride on a nightly basis. Patient is aware that FluoriSHIELD has been recently discontinued and patient will then utilize the PreviDent 5000 within the trays as needed. Trismus device was fabricated 47 mm using 25 sticks. Tongue positioner previously fabricated was inserted without difficulty. Patient was provided instructions taking the appliance in and out of the mouth without problems. All questions were answered. Patient expressed understanding. Postop instructions were provided and a written and verbal format concerning the use and care of appliances. All questions were answered. Patient to return to clinic for  periodic oral examination in approximately 2-3 weeks during radiation therapy. Patient to call for this appointment once he knows when his radiation therapy will be given. Patient to call if questions or problems arise before then.   Lenn Cal, DDS

## 2018-09-25 ENCOUNTER — Encounter: Payer: Self-pay | Admitting: Radiation Oncology

## 2018-09-25 ENCOUNTER — Ambulatory Visit
Admission: RE | Admit: 2018-09-25 | Discharge: 2018-09-25 | Disposition: A | Discharge status: Home / Self Care | Payer: Medicaid Other | Source: Ambulatory Visit | Attending: Radiation Oncology | Admitting: Radiation Oncology

## 2018-09-25 ENCOUNTER — Other Ambulatory Visit: Payer: Self-pay

## 2018-09-25 ENCOUNTER — Encounter: Payer: Self-pay | Admitting: *Deleted

## 2018-09-25 DIAGNOSIS — Z79899 Other long term (current) drug therapy: Secondary | ICD-10-CM | POA: Insufficient documentation

## 2018-09-25 DIAGNOSIS — C021 Malignant neoplasm of border of tongue: Secondary | ICD-10-CM

## 2018-09-25 DIAGNOSIS — C029 Malignant neoplasm of tongue, unspecified: Secondary | ICD-10-CM

## 2018-09-25 NOTE — Progress Notes (Addendum)
Radiation Oncology         [phone]) [phone] ________________________________  Name: Larry Glover MRN: [id number]  Date: 09/25/2018  DOB: April 19, 1986  Follow-Up Visit Note  CC: Patient, No Pcp Per  Jodi Marble, MD  Diagnosis and Prior Radiotherapy:       ICD-10-CM   1. 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  Since I saw him in consultation, he obtained an second opinion from ENT at [facility].  Recommended neck dissection.  Dr. Erik Obey performed this on  09-03-18 showing:   Diagnosis 1. Lymph node, biopsy, left neck level one - TWO BENIGN LYMPH NODES (0/2). 2. Lymph node, biopsy, left neck level two - ONE BENIGN LYMPH NODE (0/1). 3. Lymph node, biopsy, left neck level three - ONE BENIGN LYMPH NODE (0/1). 4. Lymph node, biopsy, left neck level four - ONE BENIGN LYMPH NODE (0/1). 5. Lymph nodes, regional resection, left functional neck - EIGHTEEN BENIGN LYMPH NODES (0/18).       Patient was discussed at tumor board.  Recommendation/ consensus for post op RT to the tumor bed (left tongue) only.            He has no complaints today. Healed well from surgery with a little post op fullness in the neck. Healed from dental extractions, ready for RT, per oral surgery.  ALLERGIES:  is allergic to hydrocodone.  Meds: Current Outpatient Medications  Medication Sig Dispense Refill  . ALPRAZolam (XANAX) 1 MG tablet   0  . oxyCODONE (OXY IR/ROXICODONE) 5 MG immediate release tablet Take 1-2 tablets (5-10 mg total) by mouth every 4 (four) hours as needed for moderate pain. 30 tablet 0  . sodium fluoride (FLUORISHIELD) 1.1 % GEL dental gel Instill one drop of gel per tooth space of fluoride tray. Place over teeth for 5 minutes. Remove. Spit out excess. Repeat nightly. 240 mL prn  . sodium fluoride (PREVIDENT 5000 PLUS) 1.1 % CREA dental cream Apply cream to tooth brush.  Brush teeth for 2 minutes. Spit out excess. DO NOT rinse afterwards. Repeat nightly. 1 Tube prn   No current facility-administered medications for this encounter.     Physical Findings: The patient is in no acute distress. Patient is alert and oriented. Wt Readings from Last 3 Encounters:  09/25/18 182 lb 3.2 oz (82.6 kg)  09/03/18 188 lb 4.4 oz (85.4 kg)  07/26/18 176 lb 12.8 oz (80.2 kg)    height is 5\' 11"  (1.803 m) and weight is 182 lb 3.2 oz (82.6 kg). His temperature is 98.2 F (36.8 C). His blood pressure is 126/98 (abnormal) and his pulse is 85. His respiration is 20 and oxygen saturation is 99%. .  General: Alert and oriented, in no acute distress HEENT: Head is normocephalic. Extraocular movements are intact. Oropharynx and mouth notable for well healed left lateral tongue scar. Healing well from extractions (dental) Neck: Neck is notable for no palpable adenopathy.  Left neck scar healed well with mild post op swelling Skin: Skin in treatment fields shows satisfactory healing from surgery Extremities: No cyanosis or edema. Lymphatics: see Neck Exam Psychiatric: Judgment and insight are intact. Affect is appropriate.   Lab Findings: Lab Results  Component Value Date   WBC 5.5 06/28/2018   HGB 15.7 06/28/2018   HCT 43.0 06/28/2018   MCV 93.5 06/28/2018   PLT 578 (H) 06/28/2018  Lab Results  Component Value Date   TSH 0.443 09/24/2018    Radiographic Findings: No results found.  Impression/Plan:    1) Head and Neck Cancer Status: ready for post op RT to tongue (6 wks).  Consent has been signed. Logistics and side effects again reviewed re: radiotherapy.  2) Nutritional Status: has lost some weight. Will refer to nutritionist for support PEG tube: none  3) Risk Factors: The patient has been educated about risk factors including alcohol and tobacco abuse; they understand that avoidance of alcohol and tobacco is important to prevent recurrences as well as other  cancers. Still smoking at times, advised him to quit.    4) Swallowing: good function, will see SLP at [facility] clinic  5) Dental: Encouraged to continue regular followup with dentistry, and dental hygiene including fluoride rinses. S/p extractions, cleared by oral surgeon for RT  6) Thyroid function: WNL Lab Results  Component Value Date   TSH 0.443 09/24/2018    7) Other: simulation today. [place] clinic for support, next week. PT will see him as well, and SW, SLP, Nutritionist.  I spent over 15 minutes minutes face to face with the patient and more than 50% of that time was spent in counseling and/or coordination of care. _____________________________________   Eppie Gibson, MD

## 2018-09-25 NOTE — Progress Notes (Signed)
Head and Neck Cancer Simulation, IMRT treatment planning note   Outpatient  Diagnosis:    ICD-10-CM   1. Primary tongue squamous cell carcinoma (HCC) C02.9   2. 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 An Aquaplast head and shoulder mask was custom fitted to the patient's anatomy. High-resolution CT axial imaging was obtained of the head and neck with contrast. I verified that the quality of the imaging is good for treatment planning. 1 Medically Necessary Treatment Device was fabricated and supervised by me: Aquaplast mask.   Treatment planning note I plan to treat the patient with IMRT. I plan to treat the patient's left tongue tumor bed. I plan to treat to a total dose of 60 Gray in 30  fractions. Dose calculation was ordered from dosimetry.  IMRT planning Note  IMRT is medically necessary and an important modality to deliver adequate dose to the patient's at risk tissues while sparing the patient's normal structures, including the: esophagus, parotid tissue, mandible, brain stem, spinal cord, oral cavity, brachial plexus.  This justifies the use of IMRT in the patient's treatment.  -----------------------------------  Eppie Gibson, MD

## 2018-09-25 NOTE — Progress Notes (Signed)
Has armband been applied?  Yes.    Does patient have an allergy to IV contrast dye?: No.   Has patient ever received premedication for IV contrast dye?: No.   Does patient take metformin?: No.  If patient does take metformin when was the last dose: N/A  Date of lab work: 09/24/2018  0922 BUN: 8 CR: 1.23  IV site: antecubital right, condition patent and no redness  Has IV site been added to flowsheet?  Yes.    There were no vitals taken for this visit.

## 2018-09-26 ENCOUNTER — Telehealth: Payer: Self-pay | Admitting: *Deleted

## 2018-09-26 NOTE — Telephone Encounter (Signed)
Oncology Nurse Navigator Documentation  Called Mr. Allbee:  Confirmed his attendance at next Tuesday morning's H&N MDC to see Nutrition, PT, SW, FC.  Provided 0845 arrival, explained registration procedure.  Informed 12/2 [place] changed to 1145 to accommodate 1315 appt with SLP.   He voiced understanding of information provided.  Gayleen Orem, RN, BSN Head & Neck Oncology Nurse [place] at [place] [phone]

## 2018-09-26 NOTE — Progress Notes (Signed)
Oncology Nurse Navigator Documentation  To provide support, encouragement and care continuity, met with Mr. Larry Glover during appt with Dr. Isidore Moos and his CT [facility].  He was unaccompanied. He voiced understanding of plan for post-surgical RT to R tongue, 30 fxt. He has received clearance from oral surgeon O[place] to begin RT. He voiced understanding I will coordinate his attendance at next Tuesday morning's H&N MDC.  He tolerated CT SIM without difficulty, denied questions/concerns.   I toured him to [facility] 1 treatment area, explained procedures for lobby registration, arrival to Radiation Waiting, arrival to tmt area and preparation for tmt.   I escorted him to lobby, explained registration procedure at [place].    I encouraged him to call me with questions/concerns.  Gayleen Orem, RN, BSN Head & Neck Oncology Nurse [place] at [place] [phone]

## 2018-09-28 ENCOUNTER — Encounter: Payer: Self-pay | Admitting: Radiation Oncology

## 2018-09-30 ENCOUNTER — Telehealth: Payer: Self-pay | Admitting: *Deleted

## 2018-09-30 DIAGNOSIS — C029 Malignant neoplasm of tongue, unspecified: Secondary | ICD-10-CM | POA: Diagnosis not present

## 2018-09-30 NOTE — Telephone Encounter (Signed)
Oncology Nurse Navigator Documentation  LVMM for patient reminding him of [place] arrival to Radiation Waiting following lobby registration for H&N MDC.  Gayleen Orem, RN, BSN Head & Neck Oncology Nurse [place] at [place] [phone]

## 2018-10-01 ENCOUNTER — Encounter: Payer: Self-pay | Admitting: *Deleted

## 2018-10-01 ENCOUNTER — Ambulatory Visit: Payer: Medicaid Other | Attending: Radiation Oncology | Admitting: Physical Therapy

## 2018-10-01 ENCOUNTER — Ambulatory Visit
Admission: RE | Admit: 2018-10-01 | Discharge: 2018-10-01 | Disposition: A | Discharge status: Home / Self Care | Payer: Self-pay | Source: Ambulatory Visit | Attending: Radiation Oncology | Admitting: Radiation Oncology

## 2018-10-01 ENCOUNTER — Inpatient Hospital Stay: Payer: Medicaid Other | Admitting: Nutrition

## 2018-10-01 DIAGNOSIS — C021 Malignant neoplasm of border of tongue: Secondary | ICD-10-CM

## 2018-10-01 NOTE — Therapy (Signed)
[place], [place], [address] Phone: [phone]   Fax:  [phone]  Patient Details  Name: Larry Glover MRN: [id number] Date of Birth: 1986-01-14 Referring Provider:  Eppie Gibson, MD  Encounter Date: 10/01/2018  Pt currently is uninsured. Explained to pt that he may be billed for PT. Pt states at this time he does not need PT and does not want to proceed with evaluation. Educated briefly that he may develop lymphedema since he will be receiving radiation and if he notices any swelling to ask for a referral to PT. Issued brochure about how PT can help.   Allyson Sabal [facility] 10/01/2018, 9:53 AM  [place], [place], [address] Phone: [phone]   Fax:  [phone]

## 2018-10-01 NOTE — Progress Notes (Signed)
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  Pt s/p left neck dissection with ENT on 09/03/18, no nodal involvement.  Planning radiation, 30 fractions to left tongue tumor bed starting 12/2.  Noted patient to start seeing SLP on 12/2.  Past medical history of smoker, social drinker, [place] spotted fever.  Met with patient and fiancee this am in clinic.  Patient reports that appetite is so-so.  Reports that he knows he has to eat.  Does not want to loose his taste buds but knows this is a side effect from treatment.  Reports that he eats a wide variety of foods.    Medications: xanax   Labs: reviewed   Anthropometrics:   Height: 71 inches  Weight: 186 lb 8 oz UBW: 180-185 lb BMI: 26   Estimated Energy Needs  Kcals: 2100-2500 calories Protein: 105-125 g/d Fluid: 2.5 L   NUTRITION DIAGNOSIS: Predicted suboptimal energy intake related to cancer treatments as evidenced by side effect from treatment likely effecting intake   INTERVENTION:  Discussed importance of nutrition during treatment.   Encouraged high calorie, high protein foods.  Provided handout on soft, moist protein foods Spoke briefly about oral nutrition supplements as options to try during treatment as well.   Contact information given   MONITORING, EVALUATION, GOAL: patient will consume adequate calories and protein during treatment and maintain weight   Next Visit: to be determined  Arya Boxley B. Zenia Resides, [place], [place] Registered Dietitian [phone] (pager)

## 2018-10-04 MED FILL — FLUORISHIELD 1.1% GEL: 1.1 % | 30 days supply | Qty: 228 | Fill #0

## 2018-10-05 NOTE — Progress Notes (Signed)
Oncology Nurse Navigator Documentation  Met with Mr. Alferd Apa upon his arrival for H&N [place].  He was accompanied by his fiance.  Provided verbal and written overview of [place], the clinicians who will be seeing him, encouraged him to ask questions during his time with them.  He was seen by Nutrition, PT, SW and RadOnc Investment banker, operational.  He refused PT services b/c uninsured, did not want to pay-out-of-pocket.  He is scheduled to see SLP 12/2 after [facility] RT.  Spoke with him at end of [facility], addressed questions. I encouraged him to call me with needs/concerns.  Gayleen Orem, RN, BSN Head & Neck Oncology [place] at [place] [phone]

## 2018-10-07 ENCOUNTER — Ambulatory Visit
Admission: RE | Admit: 2018-10-07 | Discharge: 2018-10-07 | Disposition: A | Discharge status: Home / Self Care | Payer: Medicaid Other | Source: Ambulatory Visit | Attending: Radiation Oncology | Admitting: Radiation Oncology

## 2018-10-07 ENCOUNTER — Ambulatory Visit: Payer: Medicaid Other

## 2018-10-07 ENCOUNTER — Encounter: Payer: Self-pay | Admitting: *Deleted

## 2018-10-07 DIAGNOSIS — C021 Malignant neoplasm of border of tongue: Secondary | ICD-10-CM | POA: Diagnosis not present

## 2018-10-07 DIAGNOSIS — C029 Malignant neoplasm of tongue, unspecified: Secondary | ICD-10-CM | POA: Diagnosis not present

## 2018-10-07 MED ORDER — SONAFINE EX EMUL
1.0000 "application " | Freq: Once | CUTANEOUS | Status: AC
  Administered 2018-10-07: 1 via TOPICAL

## 2018-10-07 NOTE — Progress Notes (Signed)
Pt here for patient teaching.  Pt given Radiation and You booklet, Managing Acute Radiation Side Effects for Head and Neck Cancer handout, skin care instructions and Sonafine.  Reviewed areas of pertinence such as fatigue, hair loss, mouth changes, skin changes, throat changes and taste changes . Pt able to give teach back of to pat skin, use unscented/gentle soap and drink plenty of water,apply Sonafine bid, avoid applying anything to skin within 4 hours of treatment and to use an electric razor if they must shave. Pt verbalizes understanding of information given and will contact nursing with any questions or concerns.     Http://rtanswers.org/treatmentinformation/whattoexpect/index      

## 2018-10-08 ENCOUNTER — Ambulatory Visit
Admission: RE | Admit: 2018-10-08 | Discharge: 2018-10-08 | Disposition: A | Discharge status: Home / Self Care | Payer: Medicaid Other | Source: Ambulatory Visit | Attending: Radiation Oncology | Admitting: Radiation Oncology

## 2018-10-08 DIAGNOSIS — C029 Malignant neoplasm of tongue, unspecified: Secondary | ICD-10-CM | POA: Diagnosis not present

## 2018-10-09 ENCOUNTER — Ambulatory Visit
Admission: RE | Admit: 2018-10-09 | Discharge: 2018-10-09 | Disposition: A | Discharge status: Home / Self Care | Payer: Medicaid Other | Source: Ambulatory Visit | Attending: Radiation Oncology | Admitting: Radiation Oncology

## 2018-10-09 DIAGNOSIS — C029 Malignant neoplasm of tongue, unspecified: Secondary | ICD-10-CM | POA: Diagnosis not present

## 2018-10-10 ENCOUNTER — Ambulatory Visit
Admission: RE | Admit: 2018-10-10 | Discharge: 2018-10-10 | Disposition: A | Discharge status: Home / Self Care | Payer: Medicaid Other | Source: Ambulatory Visit | Attending: Radiation Oncology | Admitting: Radiation Oncology

## 2018-10-10 ENCOUNTER — Encounter: Payer: Self-pay | Admitting: *Deleted

## 2018-10-10 DIAGNOSIS — C029 Malignant neoplasm of tongue, unspecified: Secondary | ICD-10-CM | POA: Diagnosis not present

## 2018-10-10 NOTE — Progress Notes (Signed)
A user error has taken place: encounter opened in error, closed for administrative reasons.

## 2018-10-10 NOTE — Progress Notes (Signed)
Oncology Nurse Navigator Documentation  Met with Larry Glover prior to [facility] RT.  He was accompanied by his fiance. He indicated he was unable to meet with SLP Garald Balding after RT d/t recent mechanical problems with his truck.  He agreed to meet with him during Tuesday morning's MDC in conjunction with RT appt. I escorted him to [facility] for tmt after which he met with Dr. Isidore Moos for weekly PUT. I encouraged him to call me with questions/concerns.  Gayleen Orem, RN, BSN Head & Neck Oncology Nurse [place] at [place] [phone]

## 2018-10-11 ENCOUNTER — Encounter: Payer: Self-pay | Admitting: Nutrition

## 2018-10-11 ENCOUNTER — Inpatient Hospital Stay: Payer: Medicaid Other | Attending: Radiation Oncology | Admitting: Nutrition

## 2018-10-11 ENCOUNTER — Ambulatory Visit
Admission: RE | Admit: 2018-10-11 | Discharge: 2018-10-11 | Disposition: A | Discharge status: Home / Self Care | Payer: Medicaid Other | Source: Ambulatory Visit | Attending: Radiation Oncology | Admitting: Radiation Oncology

## 2018-10-11 DIAGNOSIS — C029 Malignant neoplasm of tongue, unspecified: Secondary | ICD-10-CM | POA: Diagnosis not present

## 2018-10-11 NOTE — Progress Notes (Signed)
Patient did not show up for nutrition follow-up. 

## 2018-10-14 ENCOUNTER — Ambulatory Visit
Admission: RE | Admit: 2018-10-14 | Discharge: 2018-10-14 | Disposition: A | Discharge status: Home / Self Care | Payer: Medicaid Other | Source: Ambulatory Visit | Attending: Radiation Oncology | Admitting: Radiation Oncology

## 2018-10-14 DIAGNOSIS — C029 Malignant neoplasm of tongue, unspecified: Secondary | ICD-10-CM | POA: Diagnosis not present

## 2018-10-15 ENCOUNTER — Ambulatory Visit: Payer: Medicaid Other | Attending: Radiation Oncology

## 2018-10-15 ENCOUNTER — Encounter: Payer: Self-pay | Admitting: *Deleted

## 2018-10-15 ENCOUNTER — Ambulatory Visit
Admission: RE | Admit: 2018-10-15 | Discharge: 2018-10-15 | Disposition: A | Discharge status: Home / Self Care | Payer: Medicaid Other | Source: Ambulatory Visit | Attending: Radiation Oncology | Admitting: Radiation Oncology

## 2018-10-15 ENCOUNTER — Other Ambulatory Visit: Payer: Self-pay

## 2018-10-15 ENCOUNTER — Ambulatory Visit: Payer: Medicaid Other | Admitting: Nutrition

## 2018-10-15 DIAGNOSIS — C021 Malignant neoplasm of border of tongue: Secondary | ICD-10-CM

## 2018-10-15 DIAGNOSIS — C029 Malignant neoplasm of tongue, unspecified: Secondary | ICD-10-CM | POA: Diagnosis not present

## 2018-10-15 DIAGNOSIS — R1312 Dysphagia, oropharyngeal phase: Secondary | ICD-10-CM | POA: Diagnosis not present

## 2018-10-15 NOTE — Progress Notes (Signed)
Brief nutrition follow-up completed with patient during head and neck clinic. Weight improved and documented as 190 pounds December 10. Patient is receiving 30 fractions of adjuvant radiation therapy for his left lateral tongue cancer. Patient reports he is eating well and eating foods that he usually tolerates. He denies nutrition impact symptoms. He expresses concern regarding a feeding tube placement.  Nutrition diagnosis: Predicted suboptimal energy intake continues.  Intervention: I educated patient on the importance of adequate oral intake throughout treatment to minimize weight loss. Educated patient that I will work closely with him to minimize chance for feeding tube placement. Educated patient on strategies for improving thick mucus. Questions were answered and teach back method used.  Monitoring, evaluation, goals: Patient will tolerate adequate calories and protein to minimize weight loss throughout treatment.  Next visit: Tuesday, December 17.  **Disclaimer: This note was dictated with voice recognition software. Similar sounding words can inadvertently be transcribed and this note may contain transcription errors which may not have been corrected upon publication of note.**

## 2018-10-15 NOTE — Patient Instructions (Addendum)
SWALLOWING EXERCISES Do these until 6 months after your last day of radiation, then 3 times per week afterwards  1. Effortful Swallows - Press your tongue against the roof of your mouth for 3 seconds, then squeeze          the muscles in your neck while you swallow your saliva or a sip of water - Repeat 15-20 times, 2-3 times a day, and use whenever you eat or drink  2. Masako Swallow - swallow with your tongue sticking out - Stick tongue out past your teeth and gently bite tongue with your teeth - Swallow, while holding your tongue with your teeth - Repeat 15-20 times, 2-3 times a day *use a wet spoon if your mouth gets dry*  3. Shaker Exercise - head lift - Lie flat on your back in your bed or on a couch without pillows - Raise your head and look at your feet - KEEP YOUR SHOULDERS DOWN - HOLD FOR 45-60 SECONDS, then lower your head back down - Repeat 3 times, 2-3 times a day  4. Mendelsohn Maneuver - "half swallow" exercise - Start to swallow, and keep your Adam's apple up by squeezing hard with the            muscles of the throat - Hold the squeeze for 5-7 seconds and then relax - Repeat 15-20 times, 2-3 times a day *use a wet spoon if your mouth gets dry*  5. Tongue Stretch/Teeth Clean - Move your tongue around the pocket between your gums and teeth, clockwise and then counter-clockwise - Repeat on the back side, clockwise and then counter-clockwise - Repeat 15-20 times, 2-3 times a day  6. Chin pushback - optional - Open your mouth  - Place your fist UNDER your chin near your neck, and push back with your fist for 5 seconds - Repeat 10 times, 2-3 times a day      7.  Tongue strengthening  Push your tongue into your cheek and push back with your finger for 5 seconds, each side'   Repeat 10 times each side, 2-3 times per day

## 2018-10-15 NOTE — Addendum Note (Signed)
Addended by: Garald Balding B on: 10/15/2018 01:07 PM   Modules accepted: Orders

## 2018-10-15 NOTE — Therapy (Signed)
[facility] [address] [place], [place], [address] Phone: [phone]   Fax:  [phone]  Speech Language Pathology Evaluation  Patient Details  Name: Larry Glover MRN: [id number] Date of Birth: 1986/08/29 Referring Provider (SLP): Eppie Gibson, MD   Encounter Date: 10/15/2018  End of Session - 10/15/18 1003    Visit Number  1    Number of Visits  3    Date for SLP Re-Evaluation  01/17/19    SLP Start Time  0835    SLP Stop Time   0945    SLP Time Calculation (min)  70 min    Activity Tolerance  Patient tolerated treatment well       Past Medical History:  Diagnosis Date  . [facility] spotted fever   . Squamous cell carcinoma of lateral tongue ([place]) 06/28/2018   stage 1 tongue cancer    Past Surgical History:  Procedure Laterality Date  . ADENOIDECTOMY    . BACK SURGERY     32 years old- for spinal menninigitis- unsure what surgery was  . EXCISION OF TONGUE LESION Left 06/28/2018   Procedure: WIDE EXCISION OF LEFT TONGUE CANCER;  Surgeon: Jodi Marble, MD;  Location: [facility];  Service: ENT;  Laterality: Left;  . RADICAL NECK DISSECTION  09/03/2018  . RADICAL NECK DISSECTION Left 09/03/2018   Procedure: RADICAL NECK DISSECTION,SELECTIVE LEFT NECK DISSECTION;  Surgeon: Jodi Marble, MD;  Location: [facility];  Service: ENT;  Laterality: Left;  . SUPERFICIAL LYMPH NODE BIOPSY / EXCISION     in leg    There were no vitals filed for this visit.  Subjective Assessment - 10/15/18 0858    Currently in Pain?  Yes    Pain Score  2     Pain Location  Jaw   tongue   Pain Orientation  Left    Pain Descriptors / Indicators  Tender    Pain Type  Acute pain    Pain Onset  In the past 7 days    Pain Frequency  Constant         SLP Evaluation [facility] - 10/15/18 0962      SLP Visit Information   SLP Received On  10/15/18    Referring Provider (SLP)  Eppie Gibson, MD    Onset Date  January 2019    Medical Diagnosis  SCCA lt lateral tongue      Subjective   Patient/Family Stated Goal  Peace of mind that (swallowing) is ok.      Prior Functional Status   Cognitive/Linguistic Baseline  Within functional limits      Cognition   Overall Cognitive Status  Within Functional Limits for tasks assessed      Auditory Comprehension   Overall Auditory Comprehension  Appears within functional limits for tasks assessed      Verbal Expression   Overall Verbal Expression  Appears within functional limits for tasks assessed      Oral Motor/Sensory Function   Overall Oral Motor/Sensory Function  Impaired    Labial Symmetry  Abnormal symmetry left    Labial Strength  Reduced Left    Lingual ROM  Reduced left   posterior   Lingual Strength  Within Functional Limits    Lingual Coordination  WFL    Velum  Within Functional Limits    Overall Oral Motor/Sensory Function  Pt with reduced lingual ROM on lt side. Reports it is difficult for lt lingual sulcus clearance.  Motor Speech   Overall Motor Speech  Appears within functional limits for tasks assessed       Pt currently tolerates regular diet and thin liquids. SLP noted surgical changes to pt's lt lateral lingual musculature. POs: Pt ate cheese stick and drank H2O without overt s/s aspiration. Thyroid elevation appeared adequate, and swallows appeared timely. Oral residue noted as minmal/WNL. Pt's swallow deemed WNL at this time.   Because data states the risk for dysphagia during and after radiation treatment is high due to undergoing radiation tx, SLP taught pt about the possibility of reduced/limited ability for PO intake during rad tx. SLP encouraged pt to continue swallowing POs as far into rad tx as possible.   SLP educated pt re: changes to swallowing musculature after rad tx, and why adherence to dysphagia HEP provided today and PO consumption was necessary to reduce muscle fibrosis following rad tx. Pt demonstrated understanding  of these things to SLP.    SLP then developed a HEP for pt and pt was instructed how to perform exercises involving lingual, vocal, and pharyngeal strengthening. SLP performed each exercise and pt return demonstrated each exercise. SLP ensured pt performance was correct prior to moving on to next exercise. Pt was instructed to complete this program 2-3 times a day until 6 months after his or her last rad tx, then x3 a week after that.                SLP Education - 10/15/18 1001    Education Details  HEP procedure, late effects head/neck radiation, "bare minimum" follow-up ST suggestion (once, approx 4 weeks after last rad day)    Person(s) Educated  Patient;Spouse    Methods  Explanation;Demonstration;Verbal cues    Comprehension  Verbalized understanding;Returned demonstration;Verbal cues required         SLP Long Term Goals - 10/15/18 1008      SLP LONG TERM GOAL #1   Title  pt will follow HEP with modified independence    Time  2    Period  --   visits   Status  New      SLP LONG TERM GOAL #2   Title  pt will show SLP knowledge of 3 overt s/s aspiration    Time  2    Period  --   vistis   Status  New      SLP LONG TERM GOAL #3   Title  pt will demo knowledge of overt s/s ASPIRATION    Time  2    Period  --   visits   Status  New       Plan - 10/15/18 1004    Clinical Impression Statement  Pt with oropharyngeal swallowing essentially WNL, however the probability of swallowing difficulty increases dramatically with the initiation of radiation therapy. Pt will need to be followed by SLP for regular assessment of accurate HEP completion as well as for safety with POs both during and following treatment/s. Pt is self pay and desires a modified follow up ST schedule so he would like SLP to him approx 6 weeks after his last rad  therapy day.     Speech Therapy Frequency  --   approx quarterly   Duration  --   1-2 additional visits   Treatment/Interventions   Aspiration precaution training;Pharyngeal strengthening exercises;Diet toleration management by SLP;Compensatory techniques;SLP instruction and feedback;Patient/family education;Trials of upgraded texture/liquids;Environmental controls    Potential to Achieve Goals  Good    SLP  Home Exercise Plan  provided today    Consulted and Agree with Plan of Care  Patient       Patient will benefit from skilled therapeutic intervention in order to improve the following deficits and impairments:   Dysphagia, oropharyngeal phase    Problem List Patient Active Problem List   Diagnosis Date Noted  . Tongue cancer ([place]) 09/03/2018  . Squamous cell carcinoma of lateral tongue ([place]) 07/29/2018  . Primary tongue squamous cell carcinoma ([place]) 06/26/2018    [facility] 10/15/2018, 10:10 AM  [facility] [address] [place], [place], [address] Phone: [phone]   Fax:  [phone]  Name: Larry Glover MRN: [id number] Date of Birth: 10/25/1986

## 2018-10-16 ENCOUNTER — Ambulatory Visit
Admission: RE | Admit: 2018-10-16 | Discharge: 2018-10-16 | Disposition: A | Discharge status: Home / Self Care | Payer: Medicaid Other | Source: Ambulatory Visit | Attending: Radiation Oncology | Admitting: Radiation Oncology

## 2018-10-16 ENCOUNTER — Inpatient Hospital Stay: Payer: Self-pay | Admitting: Nutrition

## 2018-10-16 DIAGNOSIS — C029 Malignant neoplasm of tongue, unspecified: Secondary | ICD-10-CM | POA: Diagnosis not present

## 2018-10-17 ENCOUNTER — Ambulatory Visit
Admission: RE | Admit: 2018-10-17 | Discharge: 2018-10-17 | Disposition: A | Discharge status: Home / Self Care | Payer: Medicaid Other | Source: Ambulatory Visit | Attending: Radiation Oncology | Admitting: Radiation Oncology

## 2018-10-17 DIAGNOSIS — C029 Malignant neoplasm of tongue, unspecified: Secondary | ICD-10-CM | POA: Diagnosis not present

## 2018-10-17 NOTE — Progress Notes (Signed)
Oncology Nurse Navigator Documentation  Met with Mr. Larry Glover upon his arrival for H&N [place].  He was accompanied by his fiancee.  Provided verbal and written overview of [place], the clinicians who will be seeing him, encouraged him to ask questions during his time with them.  He was seen by SLP before proceeding to RT after which he met with Nutrition.  Spoke with him at end of [facility], addressed questions. I encouraged him to call me with needs/concerns.  Gayleen Orem, RN, BSN Head & Neck Oncology [place] at [place] [phone]

## 2018-10-18 ENCOUNTER — Ambulatory Visit: Payer: Medicaid Other

## 2018-10-21 ENCOUNTER — Ambulatory Visit
Admission: RE | Admit: 2018-10-21 | Discharge: 2018-10-21 | Disposition: A | Discharge status: Home / Self Care | Payer: Medicaid Other | Source: Ambulatory Visit | Attending: Radiation Oncology | Admitting: Radiation Oncology

## 2018-10-21 ENCOUNTER — Ambulatory Visit (HOSPITAL_COMMUNITY): Payer: Self-pay | Admitting: Dentistry

## 2018-10-21 ENCOUNTER — Encounter (HOSPITAL_COMMUNITY): Payer: Self-pay | Admitting: Dentistry

## 2018-10-21 ENCOUNTER — Other Ambulatory Visit: Payer: Self-pay | Admitting: Radiation Oncology

## 2018-10-21 VITALS — BP 121/74 | HR 73 | Temp 98.3°F | Wt 185.0 lb

## 2018-10-21 DIAGNOSIS — C021 Malignant neoplasm of border of tongue: Secondary | ICD-10-CM

## 2018-10-21 DIAGNOSIS — K1230 Oral mucositis (ulcerative), unspecified: Secondary | ICD-10-CM

## 2018-10-21 DIAGNOSIS — C029 Malignant neoplasm of tongue, unspecified: Secondary | ICD-10-CM | POA: Diagnosis not present

## 2018-10-21 DIAGNOSIS — R1310 Dysphagia, unspecified: Secondary | ICD-10-CM

## 2018-10-21 DIAGNOSIS — R432 Parageusia: Secondary | ICD-10-CM

## 2018-10-21 DIAGNOSIS — K117 Disturbances of salivary secretion: Secondary | ICD-10-CM

## 2018-10-21 DIAGNOSIS — R682 Dry mouth, unspecified: Secondary | ICD-10-CM

## 2018-10-21 MED ORDER — LIDOCAINE VISCOUS HCL 2 % MT SOLN: OROMUCOSAL | 4 refills | Status: AC

## 2018-10-21 NOTE — Patient Instructions (Signed)
RECOMMENDATIONS: 1. Brush after meals and at bedtime.  Use fluoride at bedtime. 2. Use trismus exercises as directed. 3. Use Biotene Rinse or salt water/baking soda rinses. 4. Multiple sips of water as needed. 5. Return to clinic in two months for oral exam after radiation therapy. Call if problems before then.   F. , DDS   

## 2018-10-21 NOTE — Progress Notes (Signed)
10/21/2018  Patient Name:   Larry Glover Date of Birth:   03-22-1986 Medical Record Number: [id number]  BP 121/74 (BP Location: Left Arm)   Pulse 73   Temp 98.3 F (36.8 C)   Wt 185 lb (83.9 kg)   BMI 25.80 kg/m   BARON PARMELEE presents for oral examination during radiation therapy. Patient has completed 9/30 radiation treatments. No chemotherapy.  REVIEW OF CHIEF COMPLAINTS: DRY MOUTH:  Yes. HARD TO SWALLOW:  Yes.  HURT TO SWALLOW:  Yes. TASTE CHANGES:  Taste is almost gone. SORES IN MOUTH:  Patient has sores in his mouth along his tongue. TRISMUS: Patient denies problems with trismus. WEIGHT:  185 lbs.  HOME OH REGIMEN:  BRUSHING: Patient is brushing twice a day FLOSSING:  Patient is flossing once a day RINSING:   Patient is start rinsing with Biotene and salt water/baking soda rinses.patient may use nonalcohol with strain if he so chooses. FLUORIDE:  Patient is using fluoride at bedtime. TRISMUS EXERCISES:  Maximum interincisal opening: 48 mm   DENTAL EXAM:  Oral Hygiene:(PLAQUE): Some plaque noted. Oral hygiene improvement was highly suggested. LOCATION OF MUCOSITIS:  Left lateral tongue ulceration. DESCRIPTION OF SALIVA:  Decreased saliva. ANY EXPOSED BONE:  None noted. OTHER WATCHED AREAS: Previous extraction sites. DX: Xerostomia, Dysgeusia, Dysphagia, Odynophagia, Accretions and Mucositis  RECOMMENDATIONS: 1. Brush after meals and at bedtime.  Use fluoride at bedtime. 2. Use trismus exercises as directed. 3. Use Biotene Rinse or salt water/baking soda rinses. 4. Multiple sips of water as needed. 5. Return to clinic in two months for oral exam after radiation therapy. Call if problems before then.  Lenn Cal, DDS

## 2018-10-22 ENCOUNTER — Inpatient Hospital Stay: Payer: Medicaid Other | Admitting: Nutrition

## 2018-10-22 ENCOUNTER — Encounter: Payer: Self-pay | Admitting: Nutrition

## 2018-10-22 ENCOUNTER — Ambulatory Visit
Admission: RE | Admit: 2018-10-22 | Discharge: 2018-10-22 | Disposition: A | Discharge status: Home / Self Care | Payer: Medicaid Other | Source: Ambulatory Visit | Attending: Radiation Oncology | Admitting: Radiation Oncology

## 2018-10-22 DIAGNOSIS — C029 Malignant neoplasm of tongue, unspecified: Secondary | ICD-10-CM | POA: Diagnosis not present

## 2018-10-22 NOTE — Progress Notes (Signed)
Patient did not show up for nutrition appointment. 

## 2018-10-23 ENCOUNTER — Ambulatory Visit
Admission: RE | Admit: 2018-10-23 | Discharge: 2018-10-23 | Disposition: A | Discharge status: Home / Self Care | Payer: Medicaid Other | Source: Ambulatory Visit | Attending: Radiation Oncology | Admitting: Radiation Oncology

## 2018-10-23 DIAGNOSIS — C029 Malignant neoplasm of tongue, unspecified: Secondary | ICD-10-CM | POA: Diagnosis not present

## 2018-10-24 ENCOUNTER — Ambulatory Visit
Admission: RE | Admit: 2018-10-24 | Discharge: 2018-10-24 | Disposition: A | Discharge status: Home / Self Care | Payer: Medicaid Other | Source: Ambulatory Visit | Attending: Radiation Oncology | Admitting: Radiation Oncology

## 2018-10-24 DIAGNOSIS — C029 Malignant neoplasm of tongue, unspecified: Secondary | ICD-10-CM | POA: Diagnosis not present

## 2018-10-25 ENCOUNTER — Ambulatory Visit
Admission: RE | Admit: 2018-10-25 | Discharge: 2018-10-25 | Disposition: A | Discharge status: Home / Self Care | Payer: Medicaid Other | Source: Ambulatory Visit | Attending: Radiation Oncology | Admitting: Radiation Oncology

## 2018-10-25 DIAGNOSIS — C029 Malignant neoplasm of tongue, unspecified: Secondary | ICD-10-CM | POA: Diagnosis not present

## 2018-10-28 ENCOUNTER — Ambulatory Visit
Admission: RE | Admit: 2018-10-28 | Discharge: 2018-10-28 | Disposition: A | Discharge status: Home / Self Care | Payer: Medicaid Other | Source: Ambulatory Visit | Attending: Radiation Oncology | Admitting: Radiation Oncology

## 2018-10-28 ENCOUNTER — Ambulatory Visit: Payer: Medicaid Other

## 2018-10-28 DIAGNOSIS — C029 Malignant neoplasm of tongue, unspecified: Secondary | ICD-10-CM | POA: Diagnosis not present

## 2018-10-29 ENCOUNTER — Ambulatory Visit
Admission: RE | Admit: 2018-10-29 | Discharge: 2018-10-29 | Disposition: A | Discharge status: Home / Self Care | Payer: Medicaid Other | Source: Ambulatory Visit | Attending: Radiation Oncology | Admitting: Radiation Oncology

## 2018-10-29 ENCOUNTER — Inpatient Hospital Stay: Payer: Medicaid Other | Admitting: Nutrition

## 2018-10-29 DIAGNOSIS — C029 Malignant neoplasm of tongue, unspecified: Secondary | ICD-10-CM | POA: Diagnosis not present

## 2018-10-29 NOTE — Progress Notes (Deleted)
Patient did not show up for nutrition follow-up. 

## 2018-10-31 ENCOUNTER — Ambulatory Visit
Admission: RE | Admit: 2018-10-31 | Discharge: 2018-10-31 | Disposition: A | Discharge status: Home / Self Care | Payer: Medicaid Other | Source: Ambulatory Visit | Attending: Radiation Oncology | Admitting: Radiation Oncology

## 2018-10-31 DIAGNOSIS — C029 Malignant neoplasm of tongue, unspecified: Secondary | ICD-10-CM | POA: Diagnosis not present

## 2018-11-01 ENCOUNTER — Ambulatory Visit
Admission: RE | Admit: 2018-11-01 | Discharge: 2018-11-01 | Disposition: A | Discharge status: Home / Self Care | Payer: Medicaid Other | Source: Ambulatory Visit | Attending: Radiation Oncology | Admitting: Radiation Oncology

## 2018-11-01 DIAGNOSIS — C029 Malignant neoplasm of tongue, unspecified: Secondary | ICD-10-CM | POA: Diagnosis not present

## 2018-11-04 ENCOUNTER — Ambulatory Visit
Admission: RE | Admit: 2018-11-04 | Discharge: 2018-11-04 | Disposition: A | Discharge status: Home / Self Care | Payer: Medicaid Other | Source: Ambulatory Visit | Attending: Radiation Oncology | Admitting: Radiation Oncology

## 2018-11-04 DIAGNOSIS — C029 Malignant neoplasm of tongue, unspecified: Secondary | ICD-10-CM | POA: Diagnosis not present

## 2018-11-05 ENCOUNTER — Encounter: Payer: Self-pay | Admitting: Nutrition

## 2018-11-05 ENCOUNTER — Inpatient Hospital Stay: Payer: Medicaid Other | Admitting: Nutrition

## 2018-11-05 ENCOUNTER — Ambulatory Visit
Admission: RE | Admit: 2018-11-05 | Discharge: 2018-11-05 | Disposition: A | Discharge status: Home / Self Care | Payer: Medicaid Other | Source: Ambulatory Visit | Attending: Radiation Oncology | Admitting: Radiation Oncology

## 2018-11-05 ENCOUNTER — Other Ambulatory Visit: Payer: Self-pay | Admitting: Radiation Oncology

## 2018-11-05 DIAGNOSIS — C029 Malignant neoplasm of tongue, unspecified: Secondary | ICD-10-CM | POA: Diagnosis not present

## 2018-11-05 MED ORDER — OXYCODONE HCL 5 MG/5ML PO SOLN: 5.0000 mg | ORAL | 0 refills | Status: DC | PRN

## 2018-11-05 NOTE — Progress Notes (Signed)
Patient did not show up for nutrition appointment. 

## 2018-11-07 ENCOUNTER — Ambulatory Visit
Admission: RE | Admit: 2018-11-07 | Discharge: 2018-11-07 | Disposition: A | Discharge status: Home / Self Care | Payer: Medicaid Other | Source: Ambulatory Visit | Attending: Radiation Oncology | Admitting: Radiation Oncology

## 2018-11-07 DIAGNOSIS — C021 Malignant neoplasm of border of tongue: Secondary | ICD-10-CM | POA: Insufficient documentation

## 2018-11-07 DIAGNOSIS — C029 Malignant neoplasm of tongue, unspecified: Secondary | ICD-10-CM | POA: Diagnosis present

## 2018-11-08 ENCOUNTER — Ambulatory Visit
Admission: RE | Admit: 2018-11-08 | Discharge: 2018-11-08 | Disposition: A | Discharge status: Home / Self Care | Payer: Medicaid Other | Source: Ambulatory Visit | Attending: Radiation Oncology | Admitting: Radiation Oncology

## 2018-11-08 DIAGNOSIS — C029 Malignant neoplasm of tongue, unspecified: Secondary | ICD-10-CM | POA: Diagnosis not present

## 2018-11-11 ENCOUNTER — Ambulatory Visit
Admission: RE | Admit: 2018-11-11 | Discharge: 2018-11-11 | Disposition: A | Discharge status: Home / Self Care | Payer: Medicaid Other | Source: Ambulatory Visit | Attending: Radiation Oncology | Admitting: Radiation Oncology

## 2018-11-11 DIAGNOSIS — C029 Malignant neoplasm of tongue, unspecified: Secondary | ICD-10-CM | POA: Diagnosis not present

## 2018-11-12 ENCOUNTER — Inpatient Hospital Stay: Payer: Medicaid Other | Attending: Radiation Oncology | Admitting: Nutrition

## 2018-11-12 ENCOUNTER — Encounter: Payer: Self-pay | Admitting: Nutrition

## 2018-11-12 ENCOUNTER — Ambulatory Visit
Admission: RE | Admit: 2018-11-12 | Discharge: 2018-11-12 | Disposition: A | Discharge status: Home / Self Care | Payer: Medicaid Other | Source: Ambulatory Visit | Attending: Radiation Oncology | Admitting: Radiation Oncology

## 2018-11-12 DIAGNOSIS — C029 Malignant neoplasm of tongue, unspecified: Secondary | ICD-10-CM | POA: Diagnosis not present

## 2018-11-12 NOTE — Progress Notes (Signed)
Patient did not show up for nutrition appointment. 

## 2018-11-13 ENCOUNTER — Ambulatory Visit
Admission: RE | Admit: 2018-11-13 | Discharge: 2018-11-13 | Disposition: A | Discharge status: Home / Self Care | Payer: Medicaid Other | Source: Ambulatory Visit | Attending: Radiation Oncology | Admitting: Radiation Oncology

## 2018-11-13 DIAGNOSIS — C029 Malignant neoplasm of tongue, unspecified: Secondary | ICD-10-CM | POA: Diagnosis not present

## 2018-11-14 ENCOUNTER — Ambulatory Visit
Admission: RE | Admit: 2018-11-14 | Discharge: 2018-11-14 | Disposition: A | Discharge status: Home / Self Care | Payer: Medicaid Other | Source: Ambulatory Visit | Attending: Radiation Oncology | Admitting: Radiation Oncology

## 2018-11-14 DIAGNOSIS — C029 Malignant neoplasm of tongue, unspecified: Secondary | ICD-10-CM | POA: Diagnosis not present

## 2018-11-15 ENCOUNTER — Ambulatory Visit
Admission: RE | Admit: 2018-11-15 | Discharge: 2018-11-15 | Disposition: A | Discharge status: Home / Self Care | Payer: Medicaid Other | Source: Ambulatory Visit | Attending: Radiation Oncology | Admitting: Radiation Oncology

## 2018-11-15 DIAGNOSIS — C029 Malignant neoplasm of tongue, unspecified: Secondary | ICD-10-CM | POA: Diagnosis not present

## 2018-11-18 ENCOUNTER — Other Ambulatory Visit: Payer: Self-pay | Admitting: Radiation Oncology

## 2018-11-18 ENCOUNTER — Ambulatory Visit
Admission: RE | Admit: 2018-11-18 | Discharge: 2018-11-18 | Disposition: A | Discharge status: Home / Self Care | Payer: Medicaid Other | Source: Ambulatory Visit | Attending: Radiation Oncology | Admitting: Radiation Oncology

## 2018-11-18 DIAGNOSIS — C021 Malignant neoplasm of border of tongue: Secondary | ICD-10-CM

## 2018-11-18 DIAGNOSIS — C029 Malignant neoplasm of tongue, unspecified: Secondary | ICD-10-CM | POA: Diagnosis not present

## 2018-11-18 MED ORDER — OXYCODONE HCL 5 MG/5ML PO SOLN: 5.0000 mg | ORAL | 0 refills | Status: AC | PRN

## 2018-11-18 MED FILL — oxyCODONE HCL 5 MG/5ML SOLN: 5 | 6 days supply | Qty: 250 | Fill #0

## 2018-11-19 ENCOUNTER — Encounter: Payer: Self-pay | Admitting: Nutrition

## 2018-11-19 ENCOUNTER — Ambulatory Visit
Admission: RE | Admit: 2018-11-19 | Discharge: 2018-11-19 | Disposition: A | Discharge status: Home / Self Care | Payer: Medicaid Other | Source: Ambulatory Visit | Attending: Radiation Oncology | Admitting: Radiation Oncology

## 2018-11-19 ENCOUNTER — Inpatient Hospital Stay: Payer: Medicaid Other | Admitting: Nutrition

## 2018-11-19 ENCOUNTER — Ambulatory Visit: Payer: Medicaid Other

## 2018-11-19 DIAGNOSIS — C029 Malignant neoplasm of tongue, unspecified: Secondary | ICD-10-CM | POA: Diagnosis not present

## 2018-11-19 NOTE — Progress Notes (Signed)
Patient did not show up for nutrition appointment. 

## 2018-11-20 ENCOUNTER — Ambulatory Visit
Admission: RE | Admit: 2018-11-20 | Discharge: 2018-11-20 | Disposition: A | Discharge status: Home / Self Care | Payer: Medicaid Other | Source: Ambulatory Visit | Attending: Radiation Oncology | Admitting: Radiation Oncology

## 2018-11-20 ENCOUNTER — Ambulatory Visit: Payer: Medicaid Other

## 2018-11-20 DIAGNOSIS — C029 Malignant neoplasm of tongue, unspecified: Secondary | ICD-10-CM | POA: Diagnosis not present

## 2018-11-21 ENCOUNTER — Ambulatory Visit: Payer: Medicaid Other

## 2018-11-29 ENCOUNTER — Encounter: Payer: Self-pay | Admitting: Radiation Oncology

## 2018-11-29 NOTE — Progress Notes (Signed)
  Radiation Oncology         [phone] ________________________________  Name: Larry Glover MRN: [id number]  Date: 11/29/2018  DOB: 10-28-1986  End of Treatment Note  Diagnosis:    Cancer Staging Squamous cell carcinoma of lateral tongue ([place]) Staging form: Oral Cavity, AJCC 8th Edition - Pathologic stage from 07/26/2018: Stage Unknown (pT1, pNX, cM0) - Signed by Eppie Gibson, MD on 07/29/2018 - Clinical: Stage I (cT1, cN0, cM0) - Signed by Eppie Gibson, MD on 07/29/2018  Indication for treatment:  Curative       Radiation treatment dates:   10/07/2018-11/30/2018  Site/dose:  Left Tongue tumor bed, 60 Gy in 30 fractions (total dose)  Beams/energy:   Helical IMRT / 6X  Narrative: The patient tolerated radiation treatment relatively well.  At the beginning of his treatment, he denied any symptoms. He was given sonafine and instructed on its use. Towards the end of his radiation treatment, he noted severe throat pain, mouth sores, decreased appetitie (only eating soup, water, ramen, protein shake), redness to the RT site (using homemade cream consisted of vegetable oil and cocoa butter). On PE, it was found that he had patchy mucositis.    Plan: The patient has completed radiation treatment. The patient will return to radiation oncology clinic for routine followup in one half month. I advised the patient to call or return sooner if any questions or concerns arise that are related to recovery or treatment.  -----------------------------------  Eppie Gibson, MD  This document serves as a record of services personally performed by Eppie Gibson, MD. It was created on her behalf by Steva Colder, a trained medical scribe. The creation of this record is based on the scribe's personal observations and the provider's statements to them. This document has been checked and approved by the attending provider.

## 2018-12-04 ENCOUNTER — Encounter: Payer: Self-pay | Admitting: Radiation Oncology

## 2018-12-04 NOTE — Progress Notes (Signed)
error 

## 2018-12-06 ENCOUNTER — Ambulatory Visit: Payer: Self-pay | Admitting: Radiation Oncology

## 2018-12-11 ENCOUNTER — Telehealth: Payer: Self-pay

## 2018-12-11 ENCOUNTER — Ambulatory Visit
Admission: RE | Admit: 2018-12-11 | Discharge: 2018-12-11 | Disposition: A | Discharge status: Home / Self Care | Payer: Medicaid Other | Source: Ambulatory Visit | Attending: Radiation Oncology | Admitting: Radiation Oncology

## 2018-12-11 HISTORY — DX: Personal history of irradiation: Z92.3

## 2018-12-11 NOTE — Telephone Encounter (Signed)
Larry Glover has not presented for his 2 week follow up with Dr. Isidore Moos today. I called and left a message on his voice mail asking for a return call with my direct number to reschedule.

## 2018-12-13 ENCOUNTER — Telehealth: Payer: Self-pay | Admitting: *Deleted

## 2018-12-13 ENCOUNTER — Telehealth: Payer: Self-pay

## 2018-12-13 NOTE — Telephone Encounter (Signed)
CALLED PATIENT TO RESCHEDULE MISSED FU, SPOKE WITH PATIENT AND HE IS RESCHEDULED.

## 2018-12-13 NOTE — Telephone Encounter (Signed)
Larry Glover missed his 2 week follow up appointment on 2/5 with Dr. Isidore Moos after completing radiation. I was able to contact him via phone today and have him agree to get rescheduled. He was rescheduled for the first available appointment on 12/27/18.

## 2018-12-23 NOTE — Progress Notes (Signed)
Mr. Sangiovanni presents for follow up of radiation completed 11/30/18 to his Left Tongue.   Pain issues, if any: He reports occasional throat pain, when smoking cigarettes.  Using a feeding tube?: No Weight changes, if any: He tells me that he is eating all the time.  11/05/18 176.0 lb 11/11/18 172.0 lb 11/18/18 167.0 lb 12/27/18 164 lb Swallowing issues, if any: He denies difficulty swallowing. He has difficulty with softer foods getting stuck in his throat.  Smoking or chewing tobacco? He is smoking "some"  Using fluoride trays daily? No Last ENT visit was on: Not since diagnosis Other notable issues, if any:  His skin has healed well.   BP 107/70 (BP Location: Right Arm, Patient Position: Sitting)   Pulse 90   Temp 98.7 F (37.1 C) (Oral)   Resp 20   Ht 5\' 11"  (1.803 m)   Wt 164 lb (74.4 kg)   SpO2 98%   BMI 22.87 kg/m

## 2018-12-24 ENCOUNTER — Other Ambulatory Visit (HOSPITAL_COMMUNITY): Payer: Self-pay | Admitting: Dentistry

## 2018-12-27 ENCOUNTER — Other Ambulatory Visit: Payer: Self-pay

## 2018-12-27 ENCOUNTER — Ambulatory Visit
Admission: RE | Admit: 2018-12-27 | Discharge: 2018-12-27 | Disposition: A | Discharge status: Home / Self Care | Payer: Medicaid Other | Source: Ambulatory Visit | Attending: Radiation Oncology | Admitting: Radiation Oncology

## 2018-12-27 ENCOUNTER — Encounter: Payer: Self-pay | Admitting: Radiation Oncology

## 2018-12-27 DIAGNOSIS — C021 Malignant neoplasm of border of tongue: Secondary | ICD-10-CM

## 2018-12-27 DIAGNOSIS — Z79899 Other long term (current) drug therapy: Secondary | ICD-10-CM | POA: Insufficient documentation

## 2018-12-27 DIAGNOSIS — F17210 Nicotine dependence, cigarettes, uncomplicated: Secondary | ICD-10-CM | POA: Insufficient documentation

## 2018-12-27 NOTE — Progress Notes (Signed)
Radiation Oncology         [phone]) [phone] ________________________________  Name: Larry Glover MRN: [id number]  Date: 12/27/2018  DOB: 1985-11-17  Follow-Up Visit Note  CC: Patient, No Pcp Per  Jodi Marble, MD  Diagnosis and Prior Radiotherapy:       ICD-10-CM   1. 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   Pain issues, if any: He reports occasional throat pain, when smoking cigarettes.  Using a feeding tube?: No Weight changes, if any: He tells me that he is eating all the time. Lost 3 lb in past month. 11/05/18 176.0 lb 11/11/18 172.0 lb 11/18/18 167.0 lb 12/27/18 164 lb Swallowing issues, if any: He denies difficulty swallowing. He has difficulty with softer foods getting stuck in his throat.  Smoking or chewing tobacco? He is smoking "some" and social ETOH. Using fluoride trays daily? No Last ENT visit was on: Not since diagnosis Other notable issues, if any:  His skin has healed well.            ALLERGIES:  is allergic to hydrocodone.  Meds: Current Outpatient Medications  Medication Sig Dispense Refill  . ALPRAZolam (XANAX) 1 MG tablet   0  . lidocaine (XYLOCAINE) 2 % solution Patient: Mix 1part 2% viscous lidocaine, 1part H20. Swish/spit 31mL of diluted mixture, up to every 2 hrs PRN soreness. 200 mL 4  . oxyCODONE (ROXICODONE) 5 MG/5ML solution Take 5-10 mLs (5-10 mg total) by mouth every 4 (four)  hours as needed for severe pain. 250 mL 0  . sodium fluoride (FLUORISHIELD) 1.1 % GEL dental gel Instill one drop of gel per tooth space of fluoride tray. Place over teeth for 5 minutes. Remove. Spit out excess. Repeat nightly. 240 mL prn  . sodium fluoride (PREVIDENT 5000 PLUS) 1.1 % CREA dental cream Apply cream to tooth brush. Brush teeth for 2 minutes. Spit out excess. DO NOT rinse afterwards. Repeat nightly. 1 Tube prn   No current facility-administered medications for this encounter.     Physical Findings: The patient is in no acute distress. Patient is alert and oriented. Wt Readings from Last 3 Encounters:  12/27/18 164 lb (74.4 kg)  10/21/18 185 lb (83.9 kg)  10/01/18 186 lb 8 oz (84.6 kg)    height is 5\' 11"  (1.803 m) and weight is 164 lb (74.4 kg). His oral temperature is 98.7 F (37.1 C). His blood pressure is 107/70 and his pulse is 90. His respiration is 20 and oxygen saturation is 98%. .  General: Alert and oriented, in no acute distress HEENT: Head is normocephalic. Extraocular movements are intact. Oropharynx is notable for no suspicious lesions in the throat or oral cavity upon visualization and palpation.  In the left molar bed of the mandible posteriorly there is a mucosal tag from his dental extraction but no exposed bone Neck: Neck is notable for no palpable masses. Skin: Skin in treatment fields shows  satisfactory healing  Lymphatics: see Neck Exam Psychiatric: Judgment and insight are intact. Affect is appropriate.   Lab Findings: Lab Results  Component Value Date   WBC 5.5 06/28/2018   HGB 15.7 06/28/2018   HCT 43.0 06/28/2018   MCV 93.5 06/28/2018   PLT 578 (H) 06/28/2018    Lab Results  Component Value Date   TSH 0.443 09/24/2018    Radiographic Findings: No results found.  Impression/Plan:    1) Head and Neck Cancer Status: Healing from surgery and radiotherapy  2) Nutritional Status: Well with mild weight loss, encouraged to push p.o. intake  and protein PEG tube: None  3) Risk Factors: The patient has been educated about risk factors including alcohol and tobacco abuse; they understand that avoidance of alcohol and tobacco is important to prevent recurrences as well as other cancers  I made it very clear that he needs to stop smoking cigarettes and that marijuana is not safe either.  Vaping is not safe.  He also was encouraged to stop drinking alcohol completely.  Right now he and I agree it is most realistic that he start with smoking cessation and that we will discuss alcohol cessation in the future, taking it 1 step at a time.  He understands he is at very high risk for secondary malignancies with these carcinogens  4) Swallowing: Good function  5) Dental: Encouraged to continue regular followup with dentistry, and dental hygiene including fluoride rinses.   6) Thyroid function: Unlikely to be affected by radiotherapy given his fields Lab Results  Component Value Date   TSH 0.443 09/24/2018    7)  Follow-up in 2 months with restaging CTs. The patient was encouraged to call with any issues or questions before then.   I spent 15 minutes  face to face with the patient and more than 50% of that time was spent in counseling and/or coordination of care.  _____________________________________   Eppie Gibson, MD  This document serves as a record of services personally performed by Eppie Gibson, MD. It was created on her behalf by Steva Colder, a trained medical scribe. The creation of this record is based on the scribe's personal observations and the provider's statements to them. This document has been checked and approved by the attending provider.

## 2018-12-31 ENCOUNTER — Other Ambulatory Visit: Payer: Self-pay | Admitting: Radiation Oncology

## 2018-12-31 ENCOUNTER — Encounter: Payer: Self-pay | Admitting: Radiation Oncology

## 2018-12-31 DIAGNOSIS — C021 Malignant neoplasm of border of tongue: Secondary | ICD-10-CM

## 2019-01-01 ENCOUNTER — Ambulatory Visit: Payer: Self-pay | Admitting: Radiation Oncology

## 2019-02-27 ENCOUNTER — Telehealth: Payer: Self-pay | Admitting: *Deleted

## 2019-02-27 NOTE — Telephone Encounter (Signed)
CALLED PATIENT TO INFORM THAT HE DOESN'T NEED TO COME FOR FU APPT. ON 02-28-19 DUE TO INSURANCE NOT PRECERTING TESTS YET, AFTER TESTS ARE PRECERTED AND DONE FU APPT. WILL BE RESCHEDULED TO GET RESULTS, LVM FOR A RETURN CALL

## 2019-02-28 ENCOUNTER — Other Ambulatory Visit: Payer: Self-pay | Admitting: Radiation Oncology

## 2019-02-28 ENCOUNTER — Ambulatory Visit: Payer: Medicaid Other | Admitting: Radiation Oncology

## 2019-02-28 ENCOUNTER — Telehealth: Payer: Self-pay | Admitting: *Deleted

## 2019-02-28 DIAGNOSIS — C021 Malignant neoplasm of border of tongue: Secondary | ICD-10-CM

## 2019-02-28 NOTE — Telephone Encounter (Signed)
CALLED PATIENT TO INFORM OF CT AND CHEST X-RAY FOR 03-06-19 - ARRIVAL TIME - 10:45 AM @ [facility], PT. TO HAVE WATER ONLY - 4 HRS. PRIOR TO TEST, PATIENT TO FU WITH DR. Isidore Moos FOR RESULTS ON 03-07-19, SPOKE WITH PATIENT AND HE IS AWARE OF THESE APPTS.

## 2019-02-28 NOTE — Telephone Encounter (Signed)
CALLED PATIENT TO INFORM THAT CT AND CHEST X-RAY WILL BE DONE ON 05/01/19 @ [facility]- ARRIVAL TIME - 10:45 AM, PT. TO HAVE WATER ONLY - 4 HRS. PRIOR TO TEST, CHEST X-RAY TO FOLLOW - CT AND HE WILL GET HIS RESULTS FROM DR. SQUIRE ON 05-02-19, SPOKE WITH PATIENT AND HE AGREED TO ALL DATES AND TIMES

## 2019-03-06 ENCOUNTER — Ambulatory Visit (HOSPITAL_COMMUNITY): Payer: Medicaid Other

## 2019-03-07 ENCOUNTER — Ambulatory Visit: Payer: Self-pay | Admitting: Radiation Oncology

## 2019-04-22 ENCOUNTER — Telehealth: Payer: Self-pay | Admitting: *Deleted

## 2019-04-22 NOTE — Telephone Encounter (Signed)
CALLED PATIENT TO ASK ABOUT RESCHEDULING FU ON 05/02/19 DUE TO DR. SQUIRE BEING ON VACATION, RESCHEDULED FOR 05-14-19 @ 3:30 PM, PATIENT AGREED TO NEW TIME AND DATE

## 2019-05-01 ENCOUNTER — Other Ambulatory Visit: Payer: Self-pay

## 2019-05-01 ENCOUNTER — Ambulatory Visit (HOSPITAL_COMMUNITY)
Admission: RE | Admit: 2019-05-01 | Discharge: 2019-05-01 | Disposition: A | Discharge status: Home / Self Care | Payer: Medicaid Other | Source: Ambulatory Visit | Attending: Radiation Oncology | Admitting: Radiation Oncology

## 2019-05-01 ENCOUNTER — Ambulatory Visit
Admission: RE | Admit: 2019-05-01 | Discharge: 2019-05-01 | Disposition: A | Discharge status: Home / Self Care | Payer: Medicaid Other | Source: Ambulatory Visit | Attending: Radiation Oncology | Admitting: Radiation Oncology

## 2019-05-01 DIAGNOSIS — C021 Malignant neoplasm of border of tongue: Secondary | ICD-10-CM

## 2019-05-01 LAB — BUN & CREATININE (CHCC)
BUN: 8 mg/dL (ref 6–20)
Creatinine: 1.15 mg/dL (ref 0.61–1.24)
GFR, Est AFR Am: >60 mL/min (ref >60)
GFR, Estimated: >60 mL/min (ref >60)

## 2019-05-01 MED ORDER — IOHEXOL 300 MG/ML  SOLN
75.0000 mL | Freq: Once | INTRAMUSCULAR | Status: AC | PRN
  Administered 2019-05-01: 75 mL via INTRAVENOUS

## 2019-05-01 MED ORDER — SODIUM CHLORIDE (PF) 0.9 % IJ SOLN
INTRAMUSCULAR | Status: AC
  Filled 2019-05-01: qty 50

## 2019-05-02 ENCOUNTER — Ambulatory Visit: Payer: Self-pay | Admitting: Radiation Oncology

## 2019-05-06 NOTE — Progress Notes (Signed)
Patient called. I gave him the results to his CXR and neck CT - NED. Reminded him of July 8th appt for physical exam.

## 2019-05-07 NOTE — Progress Notes (Signed)
Error

## 2019-05-14 ENCOUNTER — Ambulatory Visit
Admission: RE | Admit: 2019-05-14 | Discharge: 2019-05-14 | Disposition: A | Discharge status: Home / Self Care | Payer: Medicaid Other | Source: Ambulatory Visit | Attending: Radiation Oncology | Admitting: Radiation Oncology

## 2019-12-01 ENCOUNTER — Encounter (HOSPITAL_BASED_OUTPATIENT_CLINIC_OR_DEPARTMENT_OTHER): Payer: Self-pay | Admitting: *Deleted

## 2019-12-01 ENCOUNTER — Emergency Department (HOSPITAL_BASED_OUTPATIENT_CLINIC_OR_DEPARTMENT_OTHER)
Admission: EM | Admit: 2019-12-01 | Discharge: 2019-12-01 | Disposition: A | Discharge status: Home / Self Care | Payer: Medicaid Other | Attending: Emergency Medicine | Admitting: Emergency Medicine

## 2019-12-01 ENCOUNTER — Other Ambulatory Visit: Payer: Self-pay

## 2019-12-01 ENCOUNTER — Emergency Department (HOSPITAL_BASED_OUTPATIENT_CLINIC_OR_DEPARTMENT_OTHER): Payer: Medicaid Other

## 2019-12-01 DIAGNOSIS — M545 Low back pain, unspecified: Secondary | ICD-10-CM

## 2019-12-01 DIAGNOSIS — F17210 Nicotine dependence, cigarettes, uncomplicated: Secondary | ICD-10-CM | POA: Insufficient documentation

## 2019-12-01 DIAGNOSIS — Z79899 Other long term (current) drug therapy: Secondary | ICD-10-CM | POA: Diagnosis not present

## 2019-12-01 DIAGNOSIS — Z85810 Personal history of malignant neoplasm of tongue: Secondary | ICD-10-CM | POA: Diagnosis not present

## 2019-12-01 DIAGNOSIS — Z885 Allergy status to narcotic agent status: Secondary | ICD-10-CM | POA: Diagnosis not present

## 2019-12-01 MED ORDER — OXYCODONE HCL 5 MG PO TABS
10.0000 mg | ORAL_TABLET | Freq: Once | ORAL | Status: AC
  Administered 2019-12-01: 10 mg via ORAL
  Filled 2019-12-01: qty 2

## 2019-12-01 MED ORDER — MELOXICAM 15 MG PO TABS: 15.0000 mg | ORAL_TABLET | Freq: Every day | ORAL | 0 refills | Status: AC

## 2019-12-01 MED ORDER — METHYLPREDNISOLONE 4 MG PO TBPK: ORAL_TABLET | ORAL | 0 refills | Status: AC

## 2019-12-01 MED ORDER — METHOCARBAMOL 500 MG PO TABS: 500.0000 mg | ORAL_TABLET | Freq: Two times a day (BID) | ORAL | 0 refills | Status: AC

## 2019-12-01 MED ORDER — KETOROLAC TROMETHAMINE 30 MG/ML IJ SOLN
30.0000 mg | Freq: Once | INTRAMUSCULAR | Status: DC
  Filled 2019-12-01: qty 1

## 2019-12-01 NOTE — ED Provider Notes (Signed)
[facility] Provider Note   CSN: [id number] Arrival date & time: 12/01/19  1938     History Chief Complaint  Patient presents with   Back Pain    Larry Glover is a 34 y.o. male with a past medical history significant for squamous cell carcinoma of lateral tongue and [place] spotted fever who presents to the ED due to sudden onset of bilateral low back pain x5 days.  Patient states he was choking on a piece of ice and started coughing and then started experiencing severe low back pain.  Patient states he heard a pop in his back during this coughing attack.  Patient describes his pain as constant then intermittently sharp located on the bilateral aspects of his low back.  He states he feels pressure in the posterior aspect of his right leg mostly when standing.  Pain is relieved when sitting straight up.  Patient admits to injuring his back years ago when he fell through a roof, but denies recent injury or heavy lifting.  Patient denies numbness and tingling of lower extremities, bowel/bladder incontinence, saddle paresthesias, and lower extremity weakness.  Patient has tried [place] powder, hydrocodone, and Flexeril with no relief.  Patient denies IV drug use, fever, chills.  Patient denies abdominal pain and urinary symptoms.    Past Medical History:  Diagnosis Date   History of radiation therapy 10/07/2018- 11/30/2018   Left Tongue, 60 Gy in 30 fractions to gross disease.    [facility] spotted fever    Squamous cell carcinoma of lateral tongue ([place]) 06/28/2018   stage 1 tongue cancer    Patient Active Problem List   Diagnosis Date Noted   Tongue cancer ([place]) 09/03/2018   Squamous cell carcinoma of lateral tongue ([place]) 07/29/2018   Primary tongue squamous cell carcinoma ([place]) 06/26/2018    Past Surgical History:  Procedure Laterality Date   ADENOIDECTOMY     BACK SURGERY     34 years old- for spinal menninigitis- unsure what surgery  was   EXCISION OF TONGUE LESION Left 06/28/2018   Procedure: WIDE EXCISION OF LEFT TONGUE CANCER;  Surgeon: Jodi Marble, MD;  Location: [facility];  Service: ENT;  Laterality: Left;   RADICAL NECK DISSECTION  09/03/2018   RADICAL NECK DISSECTION Left 09/03/2018   Procedure: RADICAL NECK DISSECTION,SELECTIVE LEFT NECK DISSECTION;  Surgeon: Jodi Marble, MD;  Location: [facility];  Service: ENT;  Laterality: Left;   SUPERFICIAL LYMPH NODE BIOPSY / EXCISION     in leg       Family History  Problem Relation Age of Onset   Cancer Father     Social History   Tobacco Use   Smoking status: Current Some Day Smoker    Packs/day: 0.25    Years: 20.00    Pack years: 5.00    Types: Cigarettes   Smokeless tobacco: Never Used  Substance Use Topics   Alcohol use: Yes    Comment: 4-5 drinks weekly.    Drug use: Yes    Types: Marijuana    Comment: daily    Home Medications Prior to Admission medications   Medication Sig Start Date End Date Taking? Authorizing Provider  ALPRAZolam Duanne Moron) 1 MG tablet  09/13/18   [provider]  lidocaine (XYLOCAINE) 2 % solution Patient: Mix 1part 2% viscous lidocaine, 1part H20. Swish/spit 69mL of diluted mixture, up to every 2 hrs PRN soreness. 10/21/18   Eppie Gibson, MD  meloxicam (MOBIC) 15 MG tablet Take  1 tablet (15 mg total) by mouth daily for 20 days. 12/01/19 12/21/19  Suzy Bouchard, PA-C  methocarbamol (ROBAXIN) 500 MG tablet Take 1 tablet (500 mg total) by mouth 2 (two) times daily. 12/01/19   Suzy Bouchard, PA-C  methylPREDNISolone (MEDROL DOSEPAK) 4 MG TBPK tablet Use as directed 12/01/19   Suzy Bouchard, PA-C  oxyCODONE (ROXICODONE) 5 MG/5ML solution Take 5-10 mLs (5-10 mg total) by mouth every 4 (four) hours as needed for severe pain. 11/18/18   Eppie Gibson, MD  sodium fluoride (FLUORISHIELD) 1.1 % GEL dental gel Instill one drop of gel per tooth space of fluoride tray. Place over teeth for 5 minutes. Remove. Spit  out excess. Repeat nightly. 09/24/18   Lenn Cal, DDS  sodium fluoride (PREVIDENT 5000 PLUS) 1.1 % CREA dental cream Apply cream to tooth brush. Brush teeth for 2 minutes. Spit out excess. DO NOT rinse afterwards. Repeat nightly. 08/29/18   Lenn Cal, DDS    Allergies    Hydrocodone  Review of Systems   Review of Systems  Constitutional: Negative for chills and fever.  Cardiovascular: Negative for leg swelling.  Gastrointestinal: Negative for abdominal pain, diarrhea, nausea and vomiting.  Genitourinary: Negative for difficulty urinating, dysuria and hematuria.  Musculoskeletal: Positive for back pain, gait problem and myalgias.  Neurological: Negative for numbness.  All other systems reviewed and are negative.   Physical Exam Updated Vital Signs BP 110/63 (BP Location: Right Arm)    Pulse 80    Temp 98.2 F (36.8 C) (Oral)    Resp 17    Ht 5\' 10"  (1.778 m)    Wt 74.8 kg    SpO2 98%    BMI 23.68 kg/m   Physical Exam Vitals and nursing note reviewed.  Constitutional:      General: He is not in acute distress.    Appearance: He is not ill-appearing.  HENT:     Head: Normocephalic.  Eyes:     Pupils: Pupils are equal, round, and reactive to light.  Cardiovascular:     Rate and Rhythm: Normal rate and regular rhythm.     Pulses: Normal pulses.     Heart sounds: Normal heart sounds. No murmur. No friction rub. No gallop.   Pulmonary:     Effort: Pulmonary effort is normal.     Breath sounds: Normal breath sounds.  Abdominal:     General: Abdomen is flat. There is no distension.     Palpations: Abdomen is soft.     Tenderness: There is no abdominal tenderness. There is no right CVA tenderness, left CVA tenderness, guarding or rebound.  Musculoskeletal:     Cervical back: Neck supple.     Comments: No T-spine and L-spine midline tenderness, no stepoff or deformity, no reproducible paraspinal tenderness No leg edema bilaterally Patient moves all extremities  without difficulty. DP/PT pulses 2+ and equal bilaterally Sensation grossly intact bilaterally Strength of knee flexion and extension is 5/5 Plantar and dorsiflexion of ankle 5/5 Achilles and patellar reflexes present and equal Able to ambulate without difficulty   Skin:    General: Skin is warm and dry.     Findings: No rash.  Neurological:     General: No focal deficit present.     Mental Status: He is alert.  Psychiatric:        Mood and Affect: Mood normal.        Behavior: Behavior normal.     ED Results / Procedures /  Treatments   Labs (all labs ordered are listed, but only abnormal results are displayed) Labs Reviewed - No data to display  EKG None  Radiology DG Lumbar Spine Complete  Result Date: 12/01/2019 CLINICAL DATA:  Back pain for 4 days, began after coughing EXAM: LUMBAR SPINE - COMPLETE 4+ VIEW COMPARISON:  Radiograph Mar 15, 2018 FINDINGS: Five non-rib-bearing lumbar type vertebral bodies. Stable Schmorl's node in the inferior endplate L1 and superior endplate L3. No acute fracture or vertebral body height loss. No acute or traumatic listhesis. Mild retrolisthesis of L5 on S1 is similar to comparison CT. Intervertebral disc heights are fairly well maintained with minimal discogenic change. IMPRESSION: 1. No acute osseous abnormality. 2. Stable Schmorl's node in the inferior endplate L1 and superior endplate L3. 3. Unchanged minimal retrolisthesis L5 on S1. Electronically Signed   By: Lovena Le M.D.   On: 12/01/2019 22:02   CT Lumbar Spine Wo Contrast  Result Date: 12/01/2019 CLINICAL DATA:  Low back pain EXAM: CT LUMBAR SPINE WITHOUT CONTRAST TECHNIQUE: Multidetector CT imaging of the lumbar spine was performed without intravenous contrast administration. Multiplanar CT image reconstructions were also generated. COMPARISON:  12/01/2019 FINDINGS: Segmentation: 5 lumbar type vertebrae. Alignment: Normal. Vertebrae: No acute fracture or focal pathologic process.  Schmorl's node inferior endplate of L1 and superior endplate of L3. Paraspinal and other soft tissues: Negative. Disc levels: No significant canal stenosis. Mild disc bulge at L4-L5. Foramen are patent bilaterally. Disc spaces are maintained IMPRESSION: 1. No acute osseous abnormality. 2. Mild disc bulge at L4-L5. Electronically Signed   By: Donavan Foil M.D.   On: 12/01/2019 22:44    Procedures Procedures (including critical care time)  Medications Ordered in ED Medications  oxyCODONE (Oxy IR/ROXICODONE) immediate release tablet 10 mg (10 mg Oral Given 12/01/19 2215)    ED Course  I have reviewed the triage vital signs and the nursing notes.  Pertinent labs & imaging results that were available during my care of the patient were reviewed by me and considered in my medical decision making (see chart for details).  Clinical Course as of Dec 01 1  Mon Dec 01, 2019  2254 Patient seen by myself as well as PA provider.  This is a 34 year old male presenting to the emergency department with lower back pain that began 4 days ago when he had a coughing fit.  He felt something pop in his lower back.  Since then he has had excruciating pain.  He says it appears to be worse when he is leaning forward.  However it hurts all the time.  He says it tends to flareup at different times and at random.  He has been taking multiple medications including Flexeril and his mother's hydrocodone, reports that nothing is helping his pain.  He is also taking NSAIDS.  Nothing helps.  Denies numbness, weakness in legs, urinary incontinence.  Denies hx of back fracture or slipped disc.  On exam he can ambulate and has good ROM of the lower back.  +Straight leg test.  CT imaging consistent with disc herniation, suspect this is likely cause of symptoms.  Will add on steroid prescription, discussed likely course of symptoms, may take 4-6 weeks to recover   [MT]    Clinical Course User Index [MT] Trifan, Carola Rhine, MD    MDM Rules/Calculators/A&P                     34 year old male presents to the ED due  to severe bilateral low back pain after a coughing attack that occurred 5 days ago. Patient heard a pop in his back. History without red flags such as IVDU, weakness, saddle anesthesia, trauma, and weight loss; however, he does have a history of carcinoma to the tongue. Vitals all within normal limits. Patient in no acute distress and non-ill appearing. Physical exam completely unremarkable. No midline thoracic or lumbar tenderness. No reproducible paraspinal tenderness. Patient able to ambulate in the ED without difficulty.  Broad differential for back pain considered includes malignancy, disc herniation, spinal epidural abscess, spinal fracture, cauda equina, pyelonephritis, kidney stone, AAA, AD, pancreatitis, PE and PTX. Will obtain imaging to further evaluate for herniated disc, bony fractures, and METs given his history of cancer.  Lumbar x-ray personally reviewed which demonstrates: 1. No acute osseous abnormality.  2. Stable Schmorl's node in the inferior endplate L1 and superior  endplate L3.  3. Unchanged minimal retrolisthesis L5 on S1.   CT scan personally reviewed which demonstrates mild disc bulge at L4-L5. Suspect patient's pain related to disc herniation. Doubt cauda equina or disc herniation due to lack of saddle anesthesia/bowel or bladder incontinence or urinary retention, normal gait and reassuring physical examination without neurologic deficits. History is not supportive of kidney stone, AAA, AD, pancreatitis, PE or PTX. Patient has no CVA tenderness or urinary symptoms to suggest pyelonephritis or kidney stone.   Will manage patient conservatively at this time with NSAIDs, back exercises/stretches, heat therapy, muscle relaxer, and steroid treatment. Patient advised to follow-up with PCP if symptoms do not improve within the next few weeks. Counseled on need to return to ED for fever,  worsening or concerning symptoms. Strict ED precautions discussed with patient. Patient states understanding and agrees to plan. Patient discharged home in no acute distress and stable vitals  Final Clinical Impression(s) / ED Diagnoses Final diagnoses:  Acute bilateral low back pain without sciatica    Rx / DC Orders ED Discharge Orders         Ordered    methylPREDNISolone (MEDROL DOSEPAK) 4 MG TBPK tablet     12/01/19 2250    meloxicam (MOBIC) 15 MG tablet  Daily     12/01/19 2250    methocarbamol (ROBAXIN) 500 MG tablet  2 times daily     12/01/19 2250           Karie Kirks 12/02/19 0004    Wyvonnia Dusky, MD 12/02/19 1140

## 2019-12-01 NOTE — Discharge Instructions (Signed)
Your CT scan showed a mild bulging disc which is all symptomatic treatment. I am sending you home with a steroid dose pack, meloxicam for pain, and Robaxin for a muscle relaxer. You may purchase over the counter Lidoderm patches and Voltaren gel as needed for pain. I have also included low back pain. If your symptoms do not improve within the next few weeks, follow-up with your PCP. Return to the ER for new or worsening symptoms.

## 2019-12-01 NOTE — ED Triage Notes (Signed)
Lower back pain x 4 days. Pain started after coughing hard and feeling a pop in his lower back.

## 2019-12-30 ENCOUNTER — Encounter (HOSPITAL_BASED_OUTPATIENT_CLINIC_OR_DEPARTMENT_OTHER): Payer: Self-pay | Admitting: Emergency Medicine

## 2019-12-30 ENCOUNTER — Other Ambulatory Visit: Payer: Self-pay

## 2019-12-30 ENCOUNTER — Emergency Department (HOSPITAL_BASED_OUTPATIENT_CLINIC_OR_DEPARTMENT_OTHER)
Admission: EM | Admit: 2019-12-30 | Discharge: 2019-12-30 | Disposition: A | Discharge status: Home / Self Care | Payer: Medicaid Other | Attending: Emergency Medicine | Admitting: Emergency Medicine

## 2019-12-30 ENCOUNTER — Emergency Department (HOSPITAL_BASED_OUTPATIENT_CLINIC_OR_DEPARTMENT_OTHER): Payer: Medicaid Other

## 2019-12-30 DIAGNOSIS — Y92009 Unspecified place in unspecified non-institutional (private) residence as the place of occurrence of the external cause: Secondary | ICD-10-CM | POA: Insufficient documentation

## 2019-12-30 DIAGNOSIS — X509XXA Other and unspecified overexertion or strenuous movements or postures, initial encounter: Secondary | ICD-10-CM | POA: Diagnosis not present

## 2019-12-30 DIAGNOSIS — R0789 Other chest pain: Secondary | ICD-10-CM

## 2019-12-30 DIAGNOSIS — F17210 Nicotine dependence, cigarettes, uncomplicated: Secondary | ICD-10-CM | POA: Insufficient documentation

## 2019-12-30 DIAGNOSIS — S2231XA Fracture of one rib, right side, initial encounter for closed fracture: Secondary | ICD-10-CM | POA: Diagnosis not present

## 2019-12-30 DIAGNOSIS — Y999 Unspecified external cause status: Secondary | ICD-10-CM | POA: Diagnosis not present

## 2019-12-30 DIAGNOSIS — Z885 Allergy status to narcotic agent status: Secondary | ICD-10-CM | POA: Diagnosis not present

## 2019-12-30 DIAGNOSIS — Y9389 Activity, other specified: Secondary | ICD-10-CM | POA: Diagnosis not present

## 2019-12-30 DIAGNOSIS — Z79899 Other long term (current) drug therapy: Secondary | ICD-10-CM | POA: Insufficient documentation

## 2019-12-30 DIAGNOSIS — S299XXA Unspecified injury of thorax, initial encounter: Secondary | ICD-10-CM | POA: Diagnosis present

## 2019-12-30 HISTORY — DX: Other intervertebral disc displacement, lumbar region: M51.26

## 2019-12-30 MED ORDER — IBUPROFEN 600 MG PO TABS: 600.0000 mg | ORAL_TABLET | Freq: Three times a day (TID) | ORAL | 0 refills | Status: AC | PRN

## 2019-12-30 MED ORDER — IBUPROFEN 400 MG PO TABS
600.0000 mg | ORAL_TABLET | Freq: Once | ORAL | Status: AC
  Administered 2019-12-30: 600 mg via ORAL
  Filled 2019-12-30: qty 1

## 2019-12-30 MED ORDER — ACETAMINOPHEN 500 MG PO TABS
1000.0000 mg | ORAL_TABLET | Freq: Once | ORAL | Status: AC
  Administered 2019-12-30: 1000 mg via ORAL
  Filled 2019-12-30: qty 2

## 2019-12-30 MED FILL — IBUPROFEN 600 MG TABLET: 600 | 6 days supply | Qty: 20 | Fill #0

## 2019-12-30 NOTE — ED Triage Notes (Signed)
Right side of rib cage has been feeling "weird" for a few days. Last night in bed he felt a "pop" and had sudden severe pain.  Has been constant pain ever since. Worse with deep breath.

## 2019-12-30 NOTE — ED Notes (Signed)
ED Provider at bedside. 

## 2019-12-30 NOTE — Discharge Instructions (Signed)
It was our pleasure to provide your ER care today - we hope that you feel better.  Take acetaminophen and/or ibuprofen as need for pain.  Follow up with primary care doctor in 2-3 weeks if symptoms fail to improve/resolve.  Return to ER if worse, new symptoms, increased trouble breathing, or other concern.

## 2019-12-30 NOTE — ED Provider Notes (Signed)
[facility] EMERGENCY DEPARTMENT Provider Note   CSN: [id number] Arrival date & time: 12/30/19  1000     History Chief Complaint  Patient presents with  . Chest Pain    Larry Glover is a 34 y.o. male.  Patient c/o sharp pain to right lateral/posterior chest. Symptoms acute onset yesterday, states was sore, and then last night reached right arm around significant other and felt pop/pull in area with increased pain. Pain constant, dull to sharp, moderate, worse w certain movements/position changes. No pleuritic pain or sob. No anterior pain or anterior chest pain. No cough or uri symptoms. No fever or chills. No leg pain or swelling. No abd pain. No dysuria, hematuria or gu c/o. No midline/spine pain or radicular pain.   The history is provided by the patient.  Chest Pain Associated symptoms: no abdominal pain, no cough, no fever, no headache, no shortness of breath and no vomiting        Past Medical History:  Diagnosis Date  . History of radiation therapy 10/07/2018- 11/30/2018   Left Tongue, 60 Gy in 30 fractions to gross disease.   . Lumbar herniated disc   . [facility] spotted fever   . 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 Tongue cancer ([place]) 09/03/2018  . 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 ADENOIDECTOMY    . BACK SURGERY     34 years old- for spinal menninigitis- unsure what surgery was  . EXCISION OF TONGUE LESION Left 06/28/2018   Procedure: WIDE EXCISION OF LEFT TONGUE CANCER;  Surgeon: Jodi Marble, MD;  Location: [facility];  Service: ENT;  Laterality: Left;  . RADICAL NECK DISSECTION  09/03/2018  . RADICAL NECK DISSECTION Left 09/03/2018   Procedure: RADICAL NECK DISSECTION,SELECTIVE LEFT NECK DISSECTION;   Surgeon: Jodi Marble, MD;  Location: [facility];  Service: ENT;  Laterality: Left;  . SUPERFICIAL LYMPH NODE BIOPSY / EXCISION     in leg       Family History  Problem Relation Age of Onset  . Cancer Father     Social History   Tobacco Use  . Smoking status: Current Some Day Smoker    Packs/day: 0.50    Years: 20.00    Pack years: 10.00    Types: Cigarettes  . Smokeless tobacco: Never Used  Substance Use Topics  . Alcohol use: Yes    Comment: 4-5 drinks weekly.   . Drug use: Yes    Types: Marijuana    Comment: daily    Home Medications Prior to Admission medications   Medication Sig Start Date End Date Taking? Authorizing Provider  ALPRAZolam Duanne Moron) 1 MG tablet  09/13/18   [provider]  lidocaine (XYLOCAINE) 2 % solution Patient: Mix 1part 2% viscous lidocaine, 1part H20. Swish/spit 60mL of diluted mixture, up to every 2 hrs PRN soreness. 10/21/18   Eppie Gibson, MD  methocarbamol (ROBAXIN) 500 MG tablet Take 1 tablet (500 mg total) by mouth 2 (two) times daily. 12/01/19   Suzy Bouchard, PA-C  methylPREDNISolone (MEDROL DOSEPAK) 4 MG TBPK tablet Use as directed 12/01/19   Suzy Bouchard, PA-C  oxyCODONE (ROXICODONE) 5 MG/5ML solution Take 5-10 mLs (5-10 mg total) by mouth every 4 (four) hours as  needed for severe pain. 11/18/18   Eppie Gibson, MD  sodium fluoride (FLUORISHIELD) 1.1 % GEL dental gel Instill one drop of gel per tooth space of fluoride tray. Place over teeth for 5 minutes. Remove. Spit out excess. Repeat nightly. 09/24/18   Lenn Cal, DDS  sodium fluoride (PREVIDENT 5000 PLUS) 1.1 % CREA dental cream Apply cream to tooth brush. Brush teeth for 2 minutes. Spit out excess. DO NOT rinse afterwards. Repeat nightly. 08/29/18   Lenn Cal, DDS    Allergies    Hydrocodone  Review of Systems   Review of Systems  Constitutional: Negative for fever.  HENT: Negative for sore throat.   Eyes: Negative for redness.  Respiratory:  Negative for cough and shortness of breath.   Cardiovascular: Positive for chest pain. Negative for leg swelling.  Gastrointestinal: Negative for abdominal pain and vomiting.  Genitourinary: Negative for hematuria.  Musculoskeletal: Negative for neck pain.  Skin: Negative for rash.  Neurological: Negative for headaches.  Hematological: Does not bruise/bleed easily.  Psychiatric/Behavioral: Negative for confusion.    Physical Exam Updated Vital Signs BP 103/83 (BP Location: Right Arm)   Pulse 64   Temp 97.9 F (36.6 C) (Oral)   Resp 18   Ht 1.803 m (5\' 11" )   Wt 77.1 kg   SpO2 100%   BMI 23.71 kg/m   Physical Exam Vitals and nursing note reviewed.  Constitutional:      Appearance: Normal appearance. He is well-developed.  HENT:     Head: Atraumatic.     Nose: Nose normal.     Mouth/Throat:     Mouth: Mucous membranes are moist.     Pharynx: Oropharynx is clear.  Eyes:     General: No scleral icterus.    Conjunctiva/sclera: Conjunctivae normal.  Neck:     Trachea: No tracheal deviation.  Cardiovascular:     Rate and Rhythm: Normal rate and regular rhythm.     Pulses: Normal pulses.     Heart sounds: Normal heart sounds. No murmur. No friction rub. No gallop.   Pulmonary:     Effort: Pulmonary effort is normal. No accessory muscle usage or respiratory distress.     Breath sounds: Normal breath sounds.  Abdominal:     General: Bowel sounds are normal. There is no distension.     Palpations: Abdomen is soft.     Tenderness: There is no abdominal tenderness.  Genitourinary:    Comments: No cva tenderness. Musculoskeletal:        General: No swelling.     Cervical back: Normal range of motion and neck supple. No rigidity.     Right lower leg: No edema.     Left lower leg: No edema.     Comments: T/L spine non tender, aligned. Patient does have right lateral/posterior chest wall pain/tenderness w palpation. No crepitus to area. No sts or skin lesions in area of pain.   No leg pain, swelling, or tenderness.   Skin:    General: Skin is warm and dry.     Findings: No rash.  Neurological:     Mental Status: He is alert.     Comments: Alert, speech clear. Steady gait.   Psychiatric:        Mood and Affect: Mood normal.     ED Results / Procedures / Treatments   Labs (all labs ordered are listed, but only abnormal results are displayed) Labs Reviewed - No data to display  EKG None  Radiology  DG Chest 2 View  Result Date: 12/30/2019 CLINICAL DATA:  Right-side of ribcage has been feeling weird for several days. Felt a pop with sudden severe pain last night. EXAM: CHEST - 2 VIEW COMPARISON:  05/01/2019 FINDINGS: The heart size and mediastinal contours are within normal limits. Both lungs are clear. There is a thin lucency through the lateral aspect of the approximate right ninth rib. Suspicious for nondisplaced fracture. IMPRESSION: Suspect nondisplaced fracture involving the lateral aspect of the approximate right ninth rib. Consider further evaluation with dedicated imaging of the right ribs with fiducial marker over the area of concern. Electronically Signed   By: Kerby Moors M.D.   On: 12/30/2019 10:48    Procedures Procedures (including critical care time)  Medications Ordered in ED Medications - No data to display  ED Course  I have reviewed the triage vital signs and the nursing notes.  Pertinent labs & imaging results that were available during my care of the patient were reviewed by me and considered in my medical decision making (see chart for details).    MDM Rules/Calculators/A&P                      Xrays ordered.   Reviewed nursing notes and prior charts for additional history.   No meds today prior to ED. Xrays reviewed/interpreted by me - no pna, no ptx. ?nondisplaced rib fx.   Discussed xrays w pt.  Acetaminophen and ibuprofen po.   Patient is breathing comfortably, and appears stable for d/c.      Final Clinical  Impression(s) / ED Diagnoses Final diagnoses:  None    Rx / DC Orders ED Discharge Orders    None       Lajean Saver, MD 12/30/19 1055

## 2020-05-20 IMAGING — CT CT NECK WITH CONTRAST
4 series · 14 of 33 positions shown, 17 images · IV contrast (OMNIPAQUE)
Comparison: 06/19/2018

CLINICAL DATA: Tongue cancer follow-up

EXAM:
CT NECK WITH CONTRAST
TECHNIQUE: Multidetector CT imaging of the neck was performed using the
standard protocol following the bolus administration of intravenous
contrast.
CONTRAST:  75mL OMNIPAQUE IOHEXOL 300 MG/ML  SOLN

[Series 4: axial neck · axial · 0.54mm/px · z∈[-265,-81]mm · 5 of 138 slices shown, 7 images]
[im 23/138  soft-tissue]
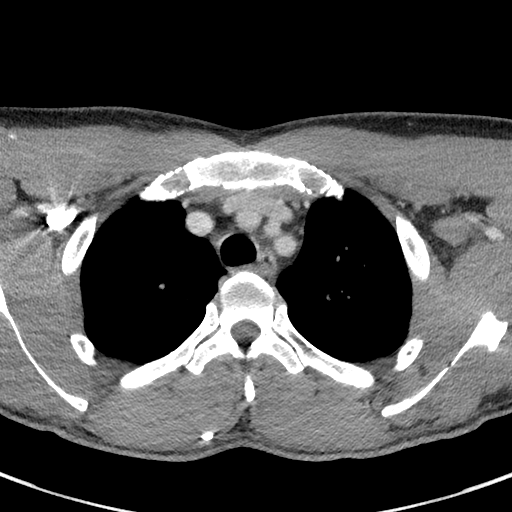
[im 23/138  bone]
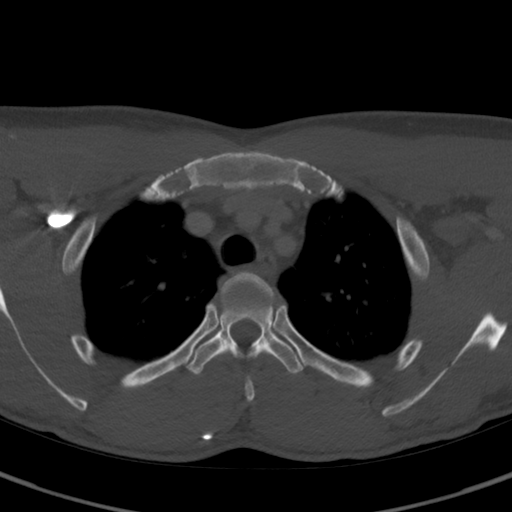
[im 46/138  bone]
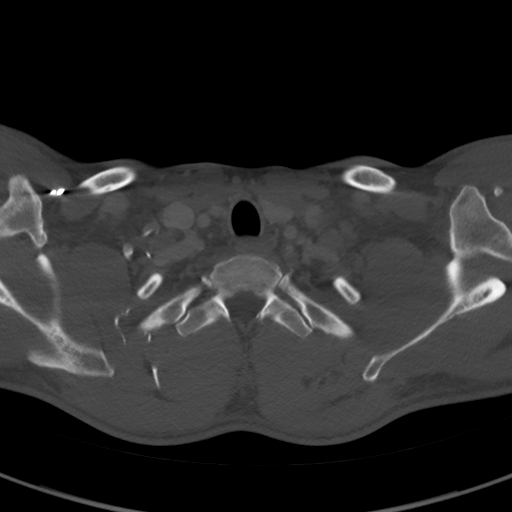
[im 69/138  bone]
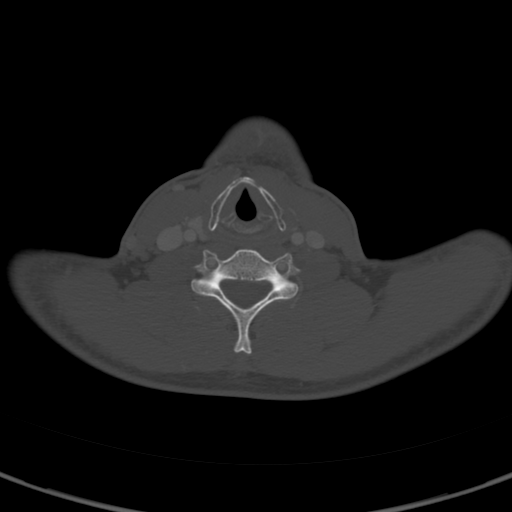
[im 92/138  bone]
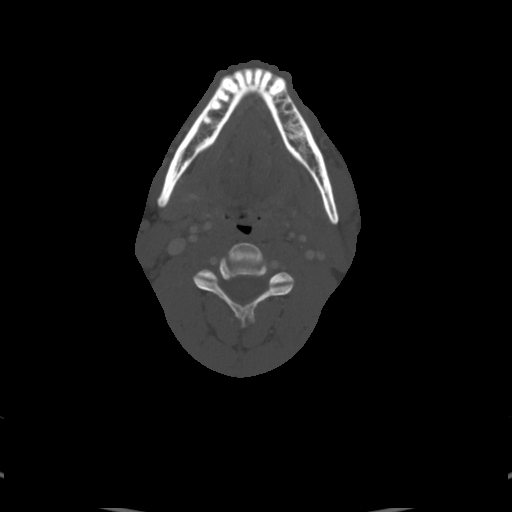
[im 115/138  soft-tissue]
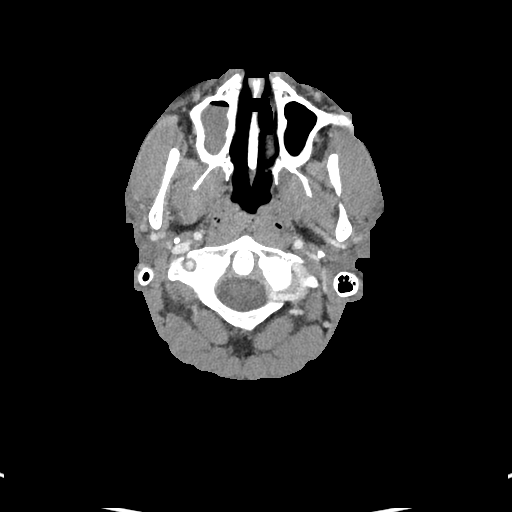
[im 115/138  bone]
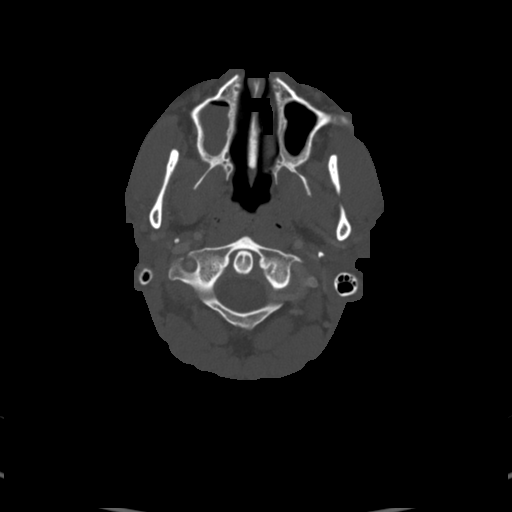

[Series 7: orthogonal ax · axial · 0.45mm/px · 1 of 138 slices shown]
[im 23/138  bone]
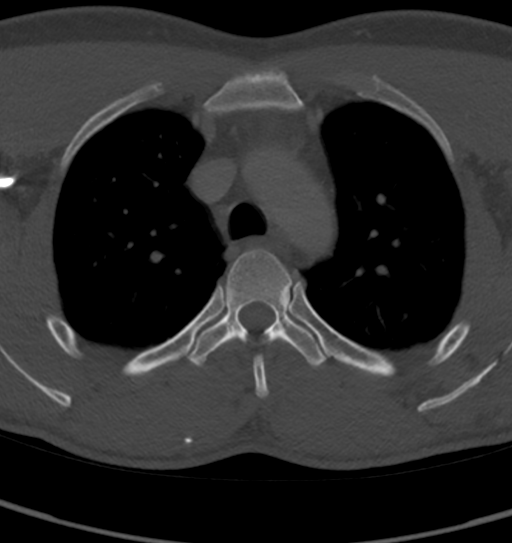

[Series 8: cor neck · coronal · 0.55mm/px · 3 of 107 slices shown]
[im 22/107  bone]
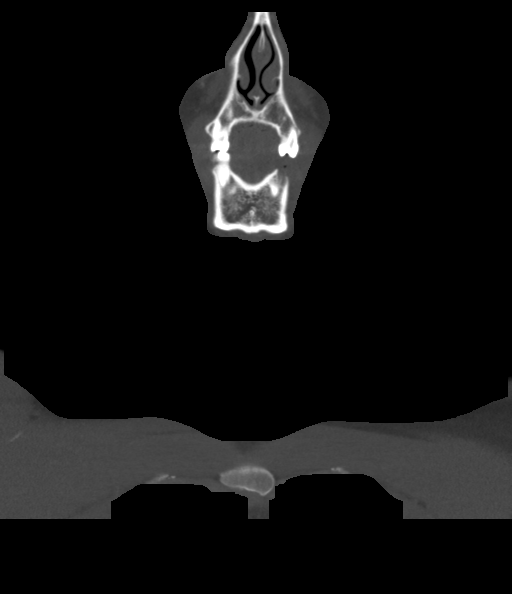
[im 43/107  bone]
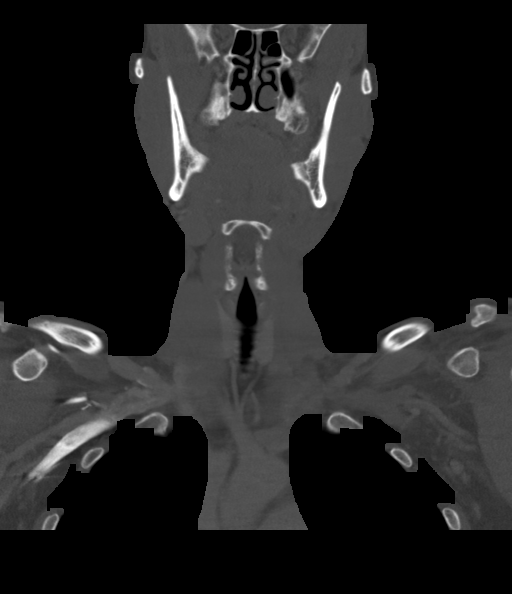
[im 64/107  bone]
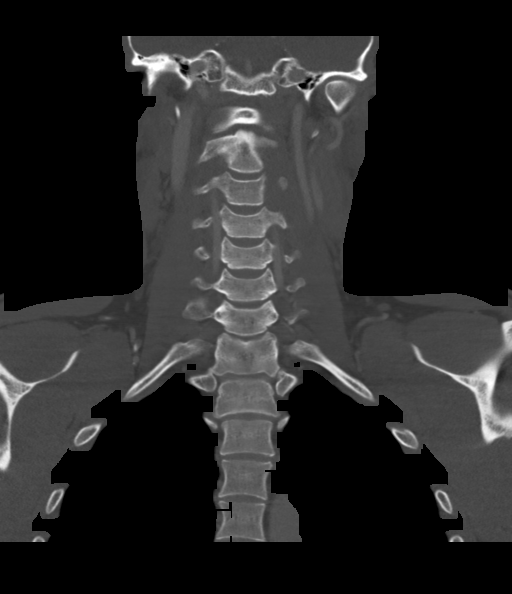

[Series 9: sag neck · sagittal · 0.53mm/px · 5 of 79 slices shown, 6 images]
[im 27/79  bone]
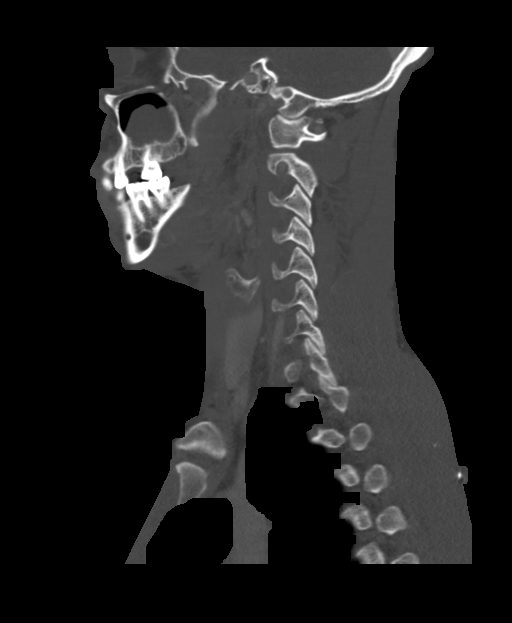
[im 33/79  bone]
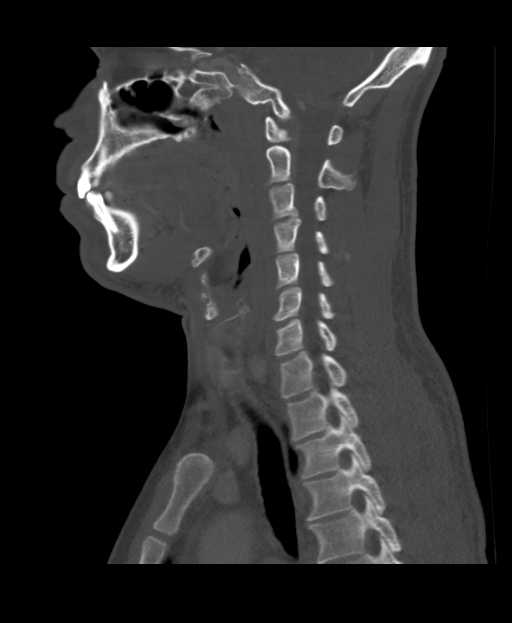
[im 40/79  soft-tissue]
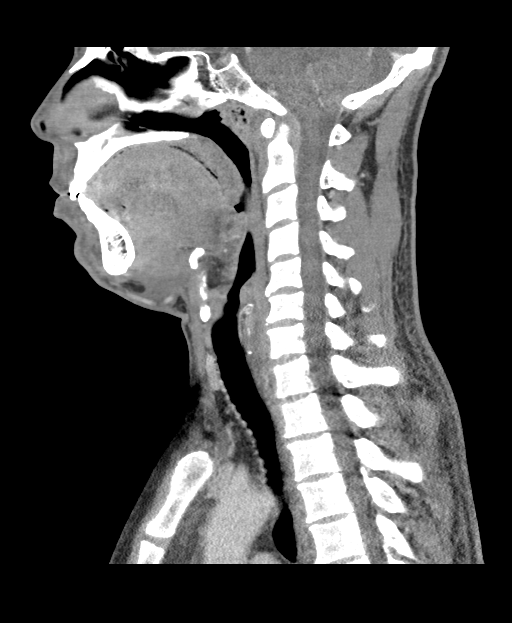
[im 40/79  bone]
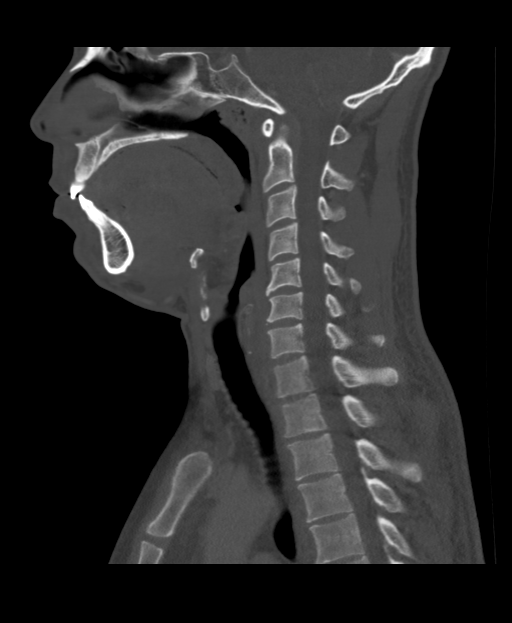
[im 46/79  bone]
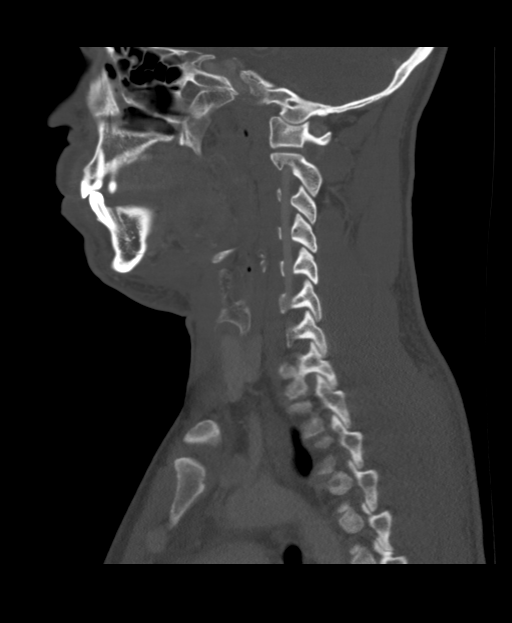
[im 53/79  bone]
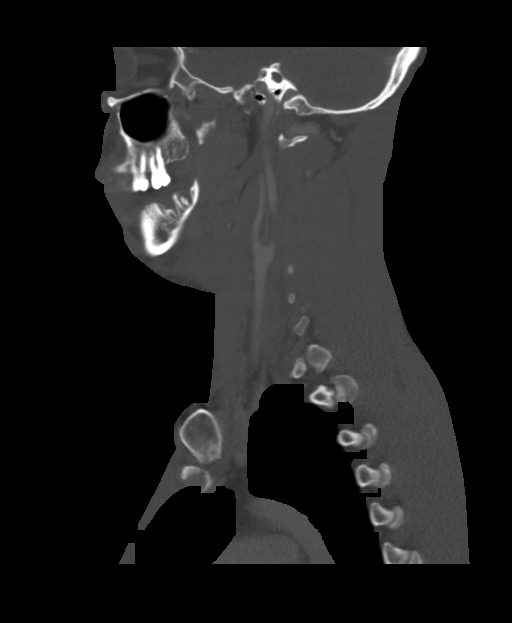

[14 of 33 positions shown; findings below may reference images not displayed]

FINDINGS: Pharynx and larynx: No evidence of recurrent primary tumor. No
evidence of metachronous disease.

Salivary glands: Indistinct left submandibular gland which is likely
treatment change. No primary salivary mass

Thyroid: Normal

Lymph nodes: No discrete nodal enlargement. There is loss of tissue
planes in the submental and left lateral neck with amorphous
morphology, volume loss, and subcutaneous reticulation. This
appearance is most compatible with scarring

Vascular: Negative.

Limited intracranial: Negative.

Visualized orbits: Negative.

Mastoids and visualized paranasal sinuses: Retention cysts in the
right maxillary sinus.

Skeleton: Negative

Upper chest: Negative
IMPRESSION: No evidence of recurrence, KRUGER category 1.

## 2020-08-06 DEATH — deceased
